# Patient Record
Sex: Male | Born: 1980 | Race: Black or African American | Hispanic: No | Marital: Single
Health system: Southern US, Academic
[De-identification: ages and names within clinical notes are randomized; demographics above are authoritative.]

## PROBLEM LIST (undated history)

## (undated) ENCOUNTER — Encounter

## (undated) ENCOUNTER — Telehealth

## (undated) ENCOUNTER — Encounter: Attending: Infectious Disease | Primary: Infectious Disease

## (undated) ENCOUNTER — Encounter
Attending: Student in an Organized Health Care Education/Training Program | Primary: Student in an Organized Health Care Education/Training Program

## (undated) ENCOUNTER — Ambulatory Visit: Payer: PRIVATE HEALTH INSURANCE | Attending: Infectious Disease | Primary: Infectious Disease

## (undated) ENCOUNTER — Ambulatory Visit

## (undated) ENCOUNTER — Encounter
Attending: Pharmacist Clinician (PhC)/ Clinical Pharmacy Specialist | Primary: Pharmacist Clinician (PhC)/ Clinical Pharmacy Specialist

## (undated) ENCOUNTER — Telehealth: Attending: Registered" | Primary: Registered"

## (undated) ENCOUNTER — Encounter: Attending: Adult Health | Primary: Adult Health

## (undated) ENCOUNTER — Telehealth: Attending: Infectious Disease | Primary: Infectious Disease

## (undated) ENCOUNTER — Telehealth: Attending: Family | Primary: Family

## (undated) ENCOUNTER — Ambulatory Visit
Payer: PRIVATE HEALTH INSURANCE | Attending: Student in an Organized Health Care Education/Training Program | Primary: Student in an Organized Health Care Education/Training Program

## (undated) ENCOUNTER — Ambulatory Visit: Attending: Clinical | Primary: Clinical

## (undated) ENCOUNTER — Ambulatory Visit: Attending: Adult Health | Primary: Adult Health

## (undated) ENCOUNTER — Ambulatory Visit: Attending: Infectious Disease | Primary: Infectious Disease

## (undated) ENCOUNTER — Ambulatory Visit: Payer: PRIVATE HEALTH INSURANCE

## (undated) DIAGNOSIS — B20 Human immunodeficiency virus [HIV] disease: Secondary | ICD-10-CM

## (undated) DIAGNOSIS — J45909 Unspecified asthma, uncomplicated: Secondary | ICD-10-CM

## (undated) HISTORY — PX: CARDIAC CATHETERIZATION: SHX172

## (undated) HISTORY — PX: CARDIAC SURGERY: SHX584

---
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
## 2015-04-09 ENCOUNTER — Encounter (HOSPITAL_COMMUNITY): Payer: Self-pay | Admitting: *Deleted

## 2015-04-09 ENCOUNTER — Emergency Department (HOSPITAL_COMMUNITY): Payer: Self-pay

## 2015-04-09 ENCOUNTER — Observation Stay (HOSPITAL_COMMUNITY)
Admission: EM | Admit: 2015-04-09 | Discharge: 2015-04-10 | Disposition: A | Discharge status: Home / Self Care | Payer: Self-pay | Attending: Family Medicine | Admitting: Family Medicine

## 2015-04-09 DIAGNOSIS — Z79899 Other long term (current) drug therapy: Secondary | ICD-10-CM | POA: Insufficient documentation

## 2015-04-09 DIAGNOSIS — B20 Human immunodeficiency virus [HIV] disease: Secondary | ICD-10-CM | POA: Insufficient documentation

## 2015-04-09 DIAGNOSIS — R9431 Abnormal electrocardiogram [ECG] [EKG]: Secondary | ICD-10-CM

## 2015-04-09 DIAGNOSIS — R519 Headache, unspecified: Secondary | ICD-10-CM | POA: Diagnosis present

## 2015-04-09 DIAGNOSIS — R079 Chest pain, unspecified: Principal | ICD-10-CM | POA: Insufficient documentation

## 2015-04-09 DIAGNOSIS — R0789 Other chest pain: Secondary | ICD-10-CM | POA: Diagnosis present

## 2015-04-09 DIAGNOSIS — Z880 Allergy status to penicillin: Secondary | ICD-10-CM | POA: Insufficient documentation

## 2015-04-09 DIAGNOSIS — J45909 Unspecified asthma, uncomplicated: Secondary | ICD-10-CM | POA: Insufficient documentation

## 2015-04-09 DIAGNOSIS — R51 Headache: Secondary | ICD-10-CM | POA: Insufficient documentation

## 2015-04-09 HISTORY — DX: Unspecified asthma, uncomplicated: J45.909

## 2015-04-09 HISTORY — DX: Human immunodeficiency virus (HIV) disease: B20

## 2015-04-09 LAB — CBC
HCT: 47.1 % (ref 39.0–52.0)
Hemoglobin: 16.2 g/dL (ref 13.0–17.0)
MCH: 33.5 pg (ref 26.0–34.0)
MCHC: 34.4 g/dL (ref 30.0–36.0)
MCV: 97.5 fL (ref 78.0–100.0)
PLATELETS: 241 10*3/uL (ref 150–400)
RBC: 4.83 MIL/uL (ref 4.22–5.81)
RDW: 11.5 % (ref 11.5–15.5)
WBC: 6.1 10*3/uL (ref 4.0–10.5)

## 2015-04-09 LAB — TROPONIN I: Troponin I: <0.03 ng/mL (ref <0.031)

## 2015-04-09 LAB — BASIC METABOLIC PANEL
Anion gap: 7 (ref 5–15)
BUN: 9 mg/dL (ref 6–20)
CALCIUM: 10.6 mg/dL — AB (ref 8.9–10.3)
CO2: 30 mmol/L (ref 22–32)
CREATININE: 1.2 mg/dL (ref 0.61–1.24)
Chloride: 104 mmol/L (ref 101–111)
GFR calc non Af Amer: >60 mL/min (ref >60)
Glucose, Bld: 96 mg/dL (ref 65–99)
Potassium: 4.5 mmol/L (ref 3.5–5.1)
SODIUM: 141 mmol/L (ref 135–145)

## 2015-04-09 LAB — I-STAT TROPONIN, ED: TROPONIN I, POC: 0 ng/mL (ref 0.00–0.08)

## 2015-04-09 MED ORDER — ONDANSETRON HCL 4 MG/2ML IJ SOLN: 4.0000 mg | Freq: Four times a day (QID) | INTRAMUSCULAR | Status: DC | PRN

## 2015-04-09 MED ORDER — FENTANYL CITRATE (PF) 100 MCG/2ML IJ SOLN
50.0000 ug | Freq: Once | INTRAMUSCULAR | Status: AC
  Administered 2015-04-09: 50 ug via INTRAVENOUS
  Filled 2015-04-09: qty 2

## 2015-04-09 MED ORDER — ABACAVIR-DOLUTEGRAVIR-LAMIVUD 600-50-300 MG PO TABS: 1.0000 | ORAL_TABLET | Freq: Every day | ORAL | Status: DC

## 2015-04-09 MED ORDER — ENOXAPARIN SODIUM 40 MG/0.4ML ~~LOC~~ SOLN
40.0000 mg | SUBCUTANEOUS | Status: DC
  Administered 2015-04-09: 40 mg via SUBCUTANEOUS
  Filled 2015-04-09: qty 0.4

## 2015-04-09 MED ORDER — ABACAVIR SULFATE 300 MG PO TABS
600.0000 mg | ORAL_TABLET | Freq: Every day | ORAL | Status: DC
  Administered 2015-04-09 – 2015-04-10 (×2): 600 mg via ORAL
  Filled 2015-04-09 (×2): qty 2

## 2015-04-09 MED ORDER — METHOCARBAMOL 750 MG PO TABS
750.0000 mg | ORAL_TABLET | Freq: Four times a day (QID) | ORAL | Status: DC | PRN
Start: 2015-04-09 — End: 2015-04-10

## 2015-04-09 MED ORDER — ACETAMINOPHEN 325 MG PO TABS: 650.0000 mg | ORAL_TABLET | ORAL | Status: DC | PRN

## 2015-04-09 MED ORDER — ASPIRIN EC 325 MG PO TBEC
325.0000 mg | DELAYED_RELEASE_TABLET | Freq: Every day | ORAL | Status: DC
  Administered 2015-04-10: 325 mg via ORAL
  Filled 2015-04-09: qty 1

## 2015-04-09 MED ORDER — GI COCKTAIL ~~LOC~~: 30.0000 mL | Freq: Four times a day (QID) | ORAL | Status: DC | PRN

## 2015-04-09 MED ORDER — ADULT MULTIVITAMIN W/MINERALS CH
1.0000 | ORAL_TABLET | Freq: Every day | ORAL | Status: DC
  Administered 2015-04-09 – 2015-04-10 (×2): 1 via ORAL
  Filled 2015-04-09 (×3): qty 1

## 2015-04-09 MED ORDER — DOLUTEGRAVIR SODIUM 50 MG PO TABS
50.0000 mg | ORAL_TABLET | Freq: Every day | ORAL | Status: DC
  Administered 2015-04-09 – 2015-04-10 (×2): 50 mg via ORAL
  Filled 2015-04-09 (×2): qty 1

## 2015-04-09 MED ORDER — MORPHINE SULFATE (PF) 4 MG/ML IV SOLN
4.0000 mg | Freq: Once | INTRAVENOUS | Status: DC
  Filled 2015-04-09: qty 1

## 2015-04-09 MED ORDER — ALPRAZOLAM 0.25 MG PO TABS: 0.2500 mg | ORAL_TABLET | Freq: Two times a day (BID) | ORAL | Status: DC | PRN

## 2015-04-09 MED ORDER — ASPIRIN 81 MG PO CHEW
324.0000 mg | CHEWABLE_TABLET | Freq: Once | ORAL | Status: AC
  Administered 2015-04-09: 324 mg via ORAL
  Filled 2015-04-09: qty 4

## 2015-04-09 MED ORDER — NITROGLYCERIN 0.4 MG SL SUBL
0.4000 mg | SUBLINGUAL_TABLET | SUBLINGUAL | Status: DC | PRN
  Administered 2015-04-09 (×3): 0.4 mg via SUBLINGUAL
  Filled 2015-04-09: qty 1

## 2015-04-09 MED ORDER — LAMIVUDINE 150 MG PO TABS
300.0000 mg | ORAL_TABLET | Freq: Every day | ORAL | Status: DC
  Administered 2015-04-09 – 2015-04-10 (×2): 300 mg via ORAL
  Filled 2015-04-09 (×2): qty 2

## 2015-04-09 MED ORDER — OXYCODONE-ACETAMINOPHEN 5-325 MG PO TABS
1.0000 | ORAL_TABLET | ORAL | Status: DC | PRN
  Administered 2015-04-09 – 2015-04-10 (×2): 1 via ORAL
  Filled 2015-04-09 (×2): qty 1

## 2015-04-09 MED ORDER — ALBUTEROL SULFATE (2.5 MG/3ML) 0.083% IN NEBU: 2.5000 mg | INHALATION_SOLUTION | Freq: Four times a day (QID) | RESPIRATORY_TRACT | Status: DC | PRN

## 2015-04-09 NOTE — ED Notes (Signed)
MD at bedside. 

## 2015-04-09 NOTE — ED Notes (Signed)
Cardiologist at bedside.  

## 2015-04-09 NOTE — H&P (Signed)
[facility] History and Physical                                                                                    Bill HumCreig Klauer, is a 34 y.o. male  MRN: [id number]   DOB - Sep 25, 1980  Admit Date - 04/09/2015  Outpatient Primary MD for the patient is No PCP Per Patient  Patient receives his care in [place].  Referring Physician:  Dr. Judd Lienelo  Chief Complaint:   Chief Complaint  Patient presents with  . Headache  . Chest Pain     HPI  Bill Rodriguez  is a 34 y.o. male, with HIV and asthma who presents to the emergency department with chest pain. Mr. Jennette Kettleeal is visiting from [facility] as his grandmother is a patient here in the hospital and in poor health. He has been here with his grandmother for several days. He has been suffering for several days with a severe headache that travels up his neck and feels like pressure in the front of his head. It is atypical for him to have this type headache. This morning upon waking his headache was severe, he felt dizzy and diaphoretic and developed an atypical chest pain that started under his right shoulder and radiated down into his central chest. It has been intermittent all day, and appears to be worse with exertion. Sublingual nitroglycerin did not improve the chest pain. Fentanyl has given him some relief.  In the emergency department, the patient's point-of-care troponin was 0 and his labs were reassuring, but his EKG looked like an acute STEMI and cardiology was called. The patient reports that in 2015 he had an abnormal EKG and went to cardiac cath. He reportedly had clean coronaries. Cardiology has requested the patient be admitted for observation and chest pain rule out.    Review of Systems   In addition to the HPI above,  No Fever-chills, No problems swallowing food or Liquids, No Abdominal pain, No Nausea or Vomiting, Bowel movements are regular, No Blood in stool or Urine, No dysuria, No new skin rashes or bruises, No new joints  pains-aches,  No new weakness, tingling, numbness in any extremity, No recent weight gain or loss, A full 10 point Review of Systems was done, except as stated above, all other Review of Systems were negative.  Past Medical History  Past Medical History  Diagnosis Date  . HIV disease (HCC)   . Asthma     Past Surgical History  Procedure Laterality Date  . Cardiac surgery    . Cardiac catheterization       Social History Social History  Substance Use Topics  . Smoking status: Never Smoker   . Smokeless tobacco: Not on file  . Alcohol Use: No    Family History His grandmother has had 2 strokes and an MI.  Prior to Admission medications   Medication Sig Start Date End Date Taking? Authorizing Provider  Abacavir-Dolutegravir-Lamivud (TRIUMEQ) 600-50-300 MG TABS Take 1 tablet by mouth daily.   Yes Historical Provider, MD  albuterol (PROVENTIL HFA;VENTOLIN HFA) 108 (90 BASE) MCG/ACT inhaler Inhale 2 puffs into the lungs every 6 (six) hours as needed  for wheezing or shortness of breath.   Yes Historical Provider, MD  Multiple Vitamin (MULTIVITAMIN) tablet Take 1 tablet by mouth daily.   Yes Historical Provider, MD  sildenafil (VIAGRA) 50 MG tablet Take 25-50 mg by mouth daily as needed for erectile dysfunction (15 minutes prior to sex).   Yes Historical Provider, MD    Allergies  Allergen Reactions  . Penicillins     Physical Exam  Vitals  Blood pressure 110/77, pulse 76, temperature 99.3 F (37.4 C), temperature source Oral, resp. rate 15, height  (1.753 m), SpO2 100 %.   General:  Thin, well-developed, well-nourished male lying in bed in NAD  Psych:  Normal affect and insight, Not Suicidal or Homicidal, Awake Alert, Oriented X 3.  Neuro:   No F.N deficits, ALL C.Nerves Intact, Strength 5/5 all 4 extremities, Sensation intact all 4 extremities.  ENT:  Ears and Eyes appear Normal, Conjunctivae clear, PER. Moist oral mucosa without erythema or exudates.  Neck:   Supple, No lymphadenopathy appreciated  Respiratory:  Symmetrical chest wall movement, Good air movement bilaterally, CTAB.  Cardiac:  RRR, No Murmurs, no LE edema noted, no JVD.    Abdomen:  Positive bowel sounds, Soft, Non tender, Non distended,  No masses appreciated  Skin:  No Cyanosis, Normal Skin Turgor, No Skin Rash or Bruise.  Extremities:  Able to move all 4. 5/5 strength in each,  no effusions.  Data Review  CBC  Recent Labs Lab 04/09/15 1353  WBC 6.1  HGB 16.2  HCT 47.1  PLT 241  MCV 97.5  MCH 33.5  MCHC 34.4  RDW 11.5    Chemistries   Recent Labs Lab 04/09/15 1353  NA 141  K 4.5  CL 104  CO2 30  GLUCOSE 96  BUN 9  CREATININE 1.20  CALCIUM 10.6*    Imaging results:   Dg Chest Portable 1 View  04/09/2015  CLINICAL DATA:  Chest pain EXAM: PORTABLE CHEST 1 VIEW COMPARISON:  None. FINDINGS: Normal heart size. Clear lungs. No pneumothorax. No pleural effusion. IMPRESSION: No active disease. Electronically Signed   By: Jolaine Click M.D.   On: 04/09/2015 14:34    My personal review of EKG: Abnormal EKG with ST elevation in multiple leads.   Assessment & Plan  Principal Problem:   Chest pain Active Problems:   Headache   HIV (human immunodeficiency virus infection) (HCC)    Chest pain  Atypical in nature. Associated with dizziness, diaphoresis, and some nausea.  Chest pain is likely worsened due to anxiety. Appreciate cardiology consultation and recommendations. We will cycle troponin. Have ordered a GI cocktail, sublingual nitroglycerin, Robaxin, when necessary Xanax, aspirin. Check 2-D echocardiogram.  Tension headache Due to his current stressful situation.  Will order when necessary Tylenol, Robaxin, heat packs  HIV Stable.  03/10/15 CD-4 count 874. Continue HIV medications. Patient receives his care in [place].    Consultants Called:  Cardiology, Dr. Patty Sermons  Family Communication:   Patient is alert, orientated and  understands their plan of care.  Code Status:  Full  Condition:  Guarded.  Potential Disposition: likely discharge 10/17 if work up is negative.  Time spent in minutes : [address]   [facility] Algis Downs,  [place] on 04/09/2015 at 3:56 PM Between 7am to 7pm - Pager - [phone] After 7pm go to [email or web address] - password [email or web address] And look for the night coverage person covering me after hours

## 2015-04-09 NOTE — ED Notes (Signed)
Attempted to call report

## 2015-04-09 NOTE — Consult Note (Signed)
CARDIOLOGY CONSULT NOTE   Patient ID: Bill Rodriguez MRN: [id number], DOB/AGE: 34   Admit date: 04/09/2015 Date of Consult: 04/09/2015   Primary Physician: No PCP Per Patient Primary Cardiologist: None  Pt. Profile  34 year old gentleman presents with headache and dizziness.  Abnormal EKG prompted question of possible STEMI.  No prior history of heart disease and he had a normal left heart catheter by right radial artery approach at [facility] in July 2015.  Problem List  Past Medical History  Diagnosis Date  . HIV disease (HCC)   . Asthma     Past Surgical History  Procedure Laterality Date  . Cardiac surgery    . Cardiac catheterization       Allergies  Allergies  Allergen Reactions  . Penicillins     HPI   This 34 year old African-American gentleman presented to the emergency room with complaints of headache and dizziness.  He states that the headaches have been present for several days and appeared to be getting worse.  He also complained of some radiation of the headache pain into his chest.  An EKG was done and a possible code STEMI was called and cardiology was asked to see him.  The patient does not have any history of known heart problems.  He does have a history of positive HIV and is followed at the HIV clinic in [place].  He does not have any available prior electrocardiogram available in epic system.  Review of the EKG taken today suggests that the ST segment elevation represents extreme benign early repolarization rather than an acute STEMI.  Interestingly, the patient gives a history that in July 2015 he had chest pain and was taken emergently to the cardiac catheterization lab at [facility] and was found to have no heart disease and he was told afterwards that he did not need to be on any aspirin or other cardiac medication.  I suspect that his electrocardiogram at that time probably looked similar to this although we do not have any actual prior EKG  to look at today.  His initial troponin I point of care today is 0.00. The patient typically stays physically active.  He goes to the gym on an almost daily basis.  He enjoys swimming for exercise as well.  He denies any history of exertional chest pain. He is on no cardiovascular medications.  He does use albuterol on a as-needed basis for wheezing and asthma.  He is on Triumeq for his HIV.  Inpatient Medications  .  morphine injection  4 mg Intravenous Once    Family History History reviewed. No pertinent family history.   Social History Social History   Social History  . Marital Status: Single    Spouse Name: N/A  . Number of Children: N/A  . Years of Education: N/A   Occupational History  . Not on file.   Social History Main Topics  . Smoking status: Never Smoker   . Smokeless tobacco: Not on file  . Alcohol Use: No  . Drug Use: No  . Sexual Activity: Not on file   Other Topics Concern  . Not on file   Social History Narrative  . No narrative on file     Review of Systems  General:  No chills, fever, night sweats or weight changes.  Cardiovascular:  No chest pain, dyspnea on exertion, edema, orthopnea, palpitations, paroxysmal nocturnal dyspnea. Dermatological: No rash, lesions/masses Respiratory: No cough, dyspnea Urologic: No hematuria, dysuria Abdominal:  No nausea, vomiting, diarrhea, bright red blood per rectum, melena, or hematemesis Neurologic:  No visual changes, wkns, changes in mental status.  Positive for headache and dizziness. All other systems reviewed and are otherwise negative except as noted above.  Physical Exam  Blood pressure 115/76, pulse 69, temperature 99.3 F (37.4 C), temperature source Oral, resp. rate 19, height 5\' 9"  (1.753 m), SpO2 100 %.  General: Pleasant, NAD Psych: Normal affect. Neuro: Alert and oriented X 3. Moves all extremities spontaneously. HEENT: Normal  Neck: Supple without bruits or JVD. Lungs:  Resp regular and  unlabored, CTA. Heart: RRR no s3, s4, or murmurs. Abdomen: Soft, non-tender, non-distended, BS + x 4.  Extremities: No clubbing, cyanosis or edema. DP/PT/Radials 2+ and equal bilaterally.  Labs  No results for input(s): CKTOTAL, CKMB, TROPONINI in the last 72 hours. Lab Results  Component Value Date   WBC 6.1 04/09/2015   HGB 16.2 04/09/2015   HCT 47.1 04/09/2015   MCV 97.5 04/09/2015   PLT 241 04/09/2015     Recent Labs Lab 04/09/15 1353  NA 141  K 4.5  CL 104  CO2 30  BUN 9  CREATININE 1.20  CALCIUM 10.6*  GLUCOSE 96   No results found for: CHOL, HDL, LDLCALC, TRIG No results found for: DDIMER  Radiology/Studies  Dg Chest Portable 1 View  04/09/2015  CLINICAL DATA:  Chest pain EXAM: PORTABLE CHEST 1 VIEW COMPARISON:  None. FINDINGS: Normal heart size. Clear lungs. No pneumothorax. No pleural effusion. IMPRESSION: No active disease. Electronically Signed   By: Jolaine ClickArthur  Hoss M.D.   On: 04/09/2015 14:34    ECG  Normal sinus rhythm.  Marked ST elevation in V2 through V5 most likely secondary to benign early repolarization.  No prior EKGs available for comparison  ASSESSMENT AND PLAN  1.  Abnormal EKG.  This appears to be most likely secondary to marked benign early repolarization.  History of [facility] heart [facility] in [facility] 2015 2.  Headaches and dizziness of uncertain etiology 3.  Positive HIV [facility]  Recommendation: Recommend observation overnight on medical service.  Evaluation of headache and dizziness per medical service.  Get serial cardiac enzymes.  Get follow-up EKG.  At discharge it would be helpful for the patient to carry a copy of his EKG with him since he is followed at several different hospitals.   Karie SchwalbeSigned, Charlane Westry MD  04/09/2015, 2:54 PM

## 2015-04-09 NOTE — ED Provider Notes (Signed)
CSN: [id number]     Arrival date & time 04/09/15  1321 History   First MD Initiated Contact with Patient 04/09/15 1352     Chief Complaint  Patient presents with  . Headache  . Chest Pain     (Consider location/radiation/quality/duration/timing/severity/associated sxs/prior Treatment) HPI Comments: Patient is a 34 year old male with history of HIV disease, asthma. He presents for evaluation of chest tightness. He states that he was upstairs visiting his family member who is ill. While he was there he developed heaviness in his chest that radiates to both shoulders. He is also reporting a headache. He denies any shortness of breath, nausea, or diaphoresis.  Patient does report that he had a heart cath performed at [facility] little over one year ago which revealed no blockages.  Patient is a 34 y.o. male presenting with chest pain. The history is provided by the patient.  Chest Pain Pain location:  Substernal area Pain quality: pressure   Pain radiates to:  Does not radiate Pain radiates to the back: no   Pain severity:  Moderate Onset quality:  Sudden Duration:  3 days Timing:  Intermittent Progression:  Worsening Chronicity:  New Relieved by:  Nothing Worsened by:  Nothing tried Ineffective treatments:  None tried   Past Medical History  Diagnosis Date  . HIV disease (HCC)   . Asthma    Past Surgical History  Procedure Laterality Date  . Cardiac surgery    . Cardiac catheterization     History reviewed. No pertinent family history. Social History  Substance Use Topics  . Smoking status: Never Smoker   . Smokeless tobacco: None  . Alcohol Use: No    Review of Systems  Cardiovascular: Positive for chest pain.  All other systems reviewed and are negative.     Allergies  Penicillins  Home Medications   Prior to Admission medications   Medication Sig Start Date End Date Taking? Authorizing Provider  Abacavir-Dolutegravir-Lamivud (TRIUMEQ) 600-50-300 MG TABS Take  1 tablet by mouth daily.   Yes Historical Provider, MD  albuterol (PROVENTIL HFA;VENTOLIN HFA) 108 (90 BASE) MCG/ACT inhaler Inhale 2 puffs into the lungs every 6 (six) hours as needed for wheezing or shortness of breath.   Yes Historical Provider, MD  Multiple Vitamin (MULTIVITAMIN) tablet Take 1 tablet by mouth daily.   Yes Historical Provider, MD  sildenafil (VIAGRA) 50 MG tablet Take 25-50 mg by mouth daily as needed for erectile dysfunction (15 minutes prior to sex).   Yes Historical Provider, MD   BP 115/76 mmHg  Pulse 69  Temp(Src) 99.3 F (37.4 C) (Oral)  Resp 19  Ht  (1.753 m)  SpO2 100% Physical Exam  Constitutional: He is oriented to person, place, and time. He appears well-developed and well-nourished. No distress.  HENT:  Head: Normocephalic and atraumatic.  Neck: Normal range of motion. Neck supple.  Cardiovascular: Normal rate, regular rhythm and normal heart sounds.   No murmur heard. Pulmonary/Chest: Effort normal and breath sounds normal. No respiratory distress. He has no wheezes. He exhibits no tenderness.  Abdominal: Soft. Bowel sounds are normal. He exhibits no distension. There is no tenderness.  Musculoskeletal: Normal range of motion. He exhibits no edema.  Neurological: He is alert and oriented to person, place, and time.  Skin: Skin is warm and dry. He is not diaphoretic.  Nursing note and vitals reviewed.   ED Course  Procedures (including critical care time) Labs Review Labs Reviewed  BASIC METABOLIC PANEL - Abnormal; Notable for  the following:    Calcium 10.6 (*)    All other components within normal limits  CBC  I-STAT TROPOININ, ED    Imaging Review Dg Chest Portable 1 View  04/09/2015  CLINICAL DATA:  Chest pain EXAM: PORTABLE CHEST 1 VIEW COMPARISON:  None. FINDINGS: Normal heart size. Clear lungs. No pneumothorax. No pleural effusion. IMPRESSION: No active disease. Electronically Signed   By: Jolaine ClickArthur  Hoss M.D.   On: 04/09/2015 14:34    I have personally reviewed and evaluated these images and lab results as part of my medical decision-making.   EKG Interpretation   Date/Time:  Sunday April 09 2015 13:44:52 EDT Ventricular Rate:  76 PR Interval:  124 QRS Duration: 78 QT Interval:  340 QTC Calculation: 382 R Axis:   59 Text Interpretation:  Critical Test Result: STEMI Normal sinus rhythm  ST elevation consider anterior injury or acute infarct ** ** ACUTE MI /  STEMI ** ** Abnormal ECG Confirmed by RAY MD, Duwayne HeckANIELLE ([id number]) on  04/09/2015 1:46:41 PM      MDM   Final diagnoses:  None    Patient presents with chest pain. He is here visiting a family member upstairs and it is a very stressful situation. He reports chest discomfort that has been ongoing intermittently for the past few days. It became much worse this afternoon. His initial EKG in triage revealed ST segment elevation in the anterior leads with no reciprocal changes. Due to the nature of his symptoms and the findings on the EKG, a code STEMI was called and the patient was immediately brought back to the emergency exam room. The initial troponin returned negative and the patient was evaluated by cardiology.  On further questioning of the patient, it was determined he had a [facility] heart [facility] little over one year ago. I was unable to obtain these records or an old EKG through the outside record file on the electronic medical record. The patient was seen by Dr. Patty SermonsBrackbill who canceled the code STEMI. He is recommending admission to medicine. I've spoken with the admitting team who will evaluate and admit the patient to the hospitalist service.  Geoffery Lyonsouglas Cletis Clack, MD 04/09/15 [phone]

## 2015-04-09 NOTE — ED Notes (Signed)
X-Ray at bedside.

## 2015-04-09 NOTE — ED Notes (Signed)
Pt reports recent headaches and sharp pains that radiates down his shoulders and into his chest and having mid chest tightness and reports mild sob this am. No acute distress noted at triage, ekg done, spo2 100%.

## 2015-04-10 ENCOUNTER — Other Ambulatory Visit (HOSPITAL_COMMUNITY): Payer: Self-pay

## 2015-04-10 DIAGNOSIS — R51 Headache: Secondary | ICD-10-CM

## 2015-04-10 DIAGNOSIS — Z21 Asymptomatic human immunodeficiency virus [HIV] infection status: Secondary | ICD-10-CM

## 2015-04-10 DIAGNOSIS — R072 Precordial pain: Secondary | ICD-10-CM

## 2015-04-10 DIAGNOSIS — R0789 Other chest pain: Secondary | ICD-10-CM

## 2015-04-10 LAB — TROPONIN I: Troponin I: <0.03 ng/mL (ref <0.031)

## 2015-04-10 MED ORDER — ALPRAZOLAM 0.25 MG PO TABS: 0.2500 mg | ORAL_TABLET | Freq: Two times a day (BID) | ORAL | Status: AC | PRN

## 2015-04-10 MED ORDER — IBUPROFEN 200 MG PO TABS: 600.0000 mg | ORAL_TABLET | Freq: Three times a day (TID) | ORAL | Status: AC | PRN

## 2015-04-10 NOTE — Progress Notes (Signed)
Pt discharged to home, condition stable, discharge education reviewed with nurse, pt verbalized understanding, left floor with family member, ambulatory.  Raymon MuttonGwen Brazen Domangue RN

## 2015-04-10 NOTE — Discharge Instructions (Signed)

## 2015-04-10 NOTE — Discharge Summary (Signed)
Physician Discharge Summary  Bill Rodriguez RUE:[id number] DOB: 05/23/1981 DOA: 04/09/2015  PCP: No PCP Per Patient  Admit date: 04/09/2015 Discharge date: 04/10/2015  Time spent: 45 minutes  Recommendations for Outpatient Follow-up:   1. Needs follow-up as an outpatient for noncardiac chest pain which I suspect is secondary to anxiety from familial issues 2. Prescribed limited amount of Xanax this admission 30 tablets for anxiety and also recommended ibuprofen OTC 3 times a day for couple days for headaches  Discharge Diagnoses:  Principal Problem:   Chest pain Active Problems:   Headache   HIV (human immunodeficiency virus infection) (HCC)   Atypical chest pain   Discharge Condition: Good  Diet recommendation: Heart healthy low-salt  Filed Weights   04/09/15 1658 04/10/15 0506  Weight: 72.621 kg (160 lb 1.6 oz) 74.707 kg (164 lb 11.2 oz)    Hospital Course:  Pleasant 34 year old HIV [last CD4 74] positive homosexual male followed at [facility]  admitted to the hospital chest pain in setting of grandmother being ill and on life support On admission cardiology was consulted was noted that he had predominantly J-point elevation in keeping with habitus being very thin. Chest pain was associated with headache, neck pain and no radiation. 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    Discharge Instructions   Discharge Instructions    Diet - low sodium heart healthy    Complete by:  As directed      Discharge instructions    Complete by:  As directed   It is unlikely you have had a heart issue Your tests are negative and non-suggestive of this For your headaches you may take Ibuprofen short term [around 3 days] at a dose of 600 mg and you can take this upto 3 times a day Please follow up with your regular MD as an outpatient if these issues recur     Increase activity slowly    Complete by:  As directed           Current Discharge Medication List    START taking these medications   Details  ALPRAZolam (XANAX) 0.25 MG tablet Take 1 tablet (0.25 mg total) by mouth 2 (two) times daily as needed for anxiety. Qty: 30 tablet, Refills: 0    ibuprofen (ADVIL) 200 MG tablet Take 3 tablets (600 mg total) by mouth every 8 (eight) hours as needed for headache. Qty: 30 tablet, Refills: 0      CONTINUE these medications which have NOT CHANGED   Details  Abacavir-Dolutegravir-Lamivud (TRIUMEQ) 600-50-300 MG TABS Take 1 tablet by mouth daily.    albuterol (PROVENTIL HFA;VENTOLIN HFA)  108 (90 BASE) MCG/ACT inhaler Inhale 2 puffs into the lungs every 6 (six) hours as needed for wheezing or shortness of breath.    Multiple Vitamin (MULTIVITAMIN) tablet Take 1 tablet by mouth daily.    sildenafil (VIAGRA) 50 MG tablet Take 25-50 mg by mouth daily as needed for erectile dysfunction (15 minutes prior to sex).       Allergies  Allergen Reactions  . Penicillins       The results of significant diagnostics from this hospitalization (including imaging, microbiology, ancillary and  laboratory) are listed below for reference.    Significant Diagnostic Studies: Dg Chest Portable 1 View  04/09/2015  CLINICAL DATA:  Chest pain EXAM: PORTABLE CHEST 1 VIEW COMPARISON:  None. FINDINGS: Normal heart size. Clear lungs. No pneumothorax. No pleural effusion. IMPRESSION: No active disease. Electronically Signed   By: Jolaine ClickArthur  Hoss M.D.   On: 04/09/2015 14:34    Microbiology: No results found for this or any previous visit (from the past 240 hour(s)).   Labs: Basic Metabolic Panel:  Recent Labs Lab 04/09/15 1353  NA 141  K 4.5  CL 104  CO2 30  GLUCOSE 96  BUN 9  CREATININE 1.20  CALCIUM 10.6*   Liver Function Tests: No results for input(s): AST, ALT, ALKPHOS, BILITOT, PROT, ALBUMIN in the last 168 hours. No results for input(s): LIPASE, AMYLASE in the last 168 hours. No results for input(s): AMMONIA in the last 168 hours. CBC:  Recent Labs Lab 04/09/15 1353  WBC 6.1  HGB 16.2  HCT 47.1  MCV 97.5  PLT 241   Cardiac Enzymes:  Recent Labs Lab 04/09/15 1753 04/09/15 2330 04/10/15 0436  TROPONINI <0.03 <0.03 <0.03   BNP: BNP (last 3 results) No results for input(s): BNP in the last 8760 hours.  ProBNP (last 3 results) No results for input(s): PROBNP in the last 8760 hours.  CBG: No results for input(s): GLUCAP in the last 168 hours.     SignedRhetta Mura:  Esly Selvage, JAI-GURMUKH  [facility] 04/10/2015, 9:28 AM    HIV: --started triumeq 2014  -Had 4-5 day gap while waiting for meds to come, not sure what hold up was  Had perioral rash break out a couple times in last few months that ingles Rash is little white bumps like whiteheads -has not figured out trigger -no rash on rest of body, no swelling of tongue or back or throat -previously happened to kiwi -not eat mango  Asthma - -using albuterol about once every two weeks  -no asthma attacks  Primary Hyperparathyroidism: - last two Ca+ has been 10.3 with previous at 8.9 -work up for  hyperparathyroidism including scan showed no adenoma -takes MVI and vitamin D -no kidney stones ever  Anxiety: -weaned off xanax and celexa -feels like anxiety better  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

## 2016-08-23 IMAGING — CR DG CHEST 1V PORT
1 series · 1 of 1 positions shown · non-contrast
Comparison: None.

CLINICAL DATA: Chest pain

EXAM:
PORTABLE CHEST 1 VIEW

[AP]
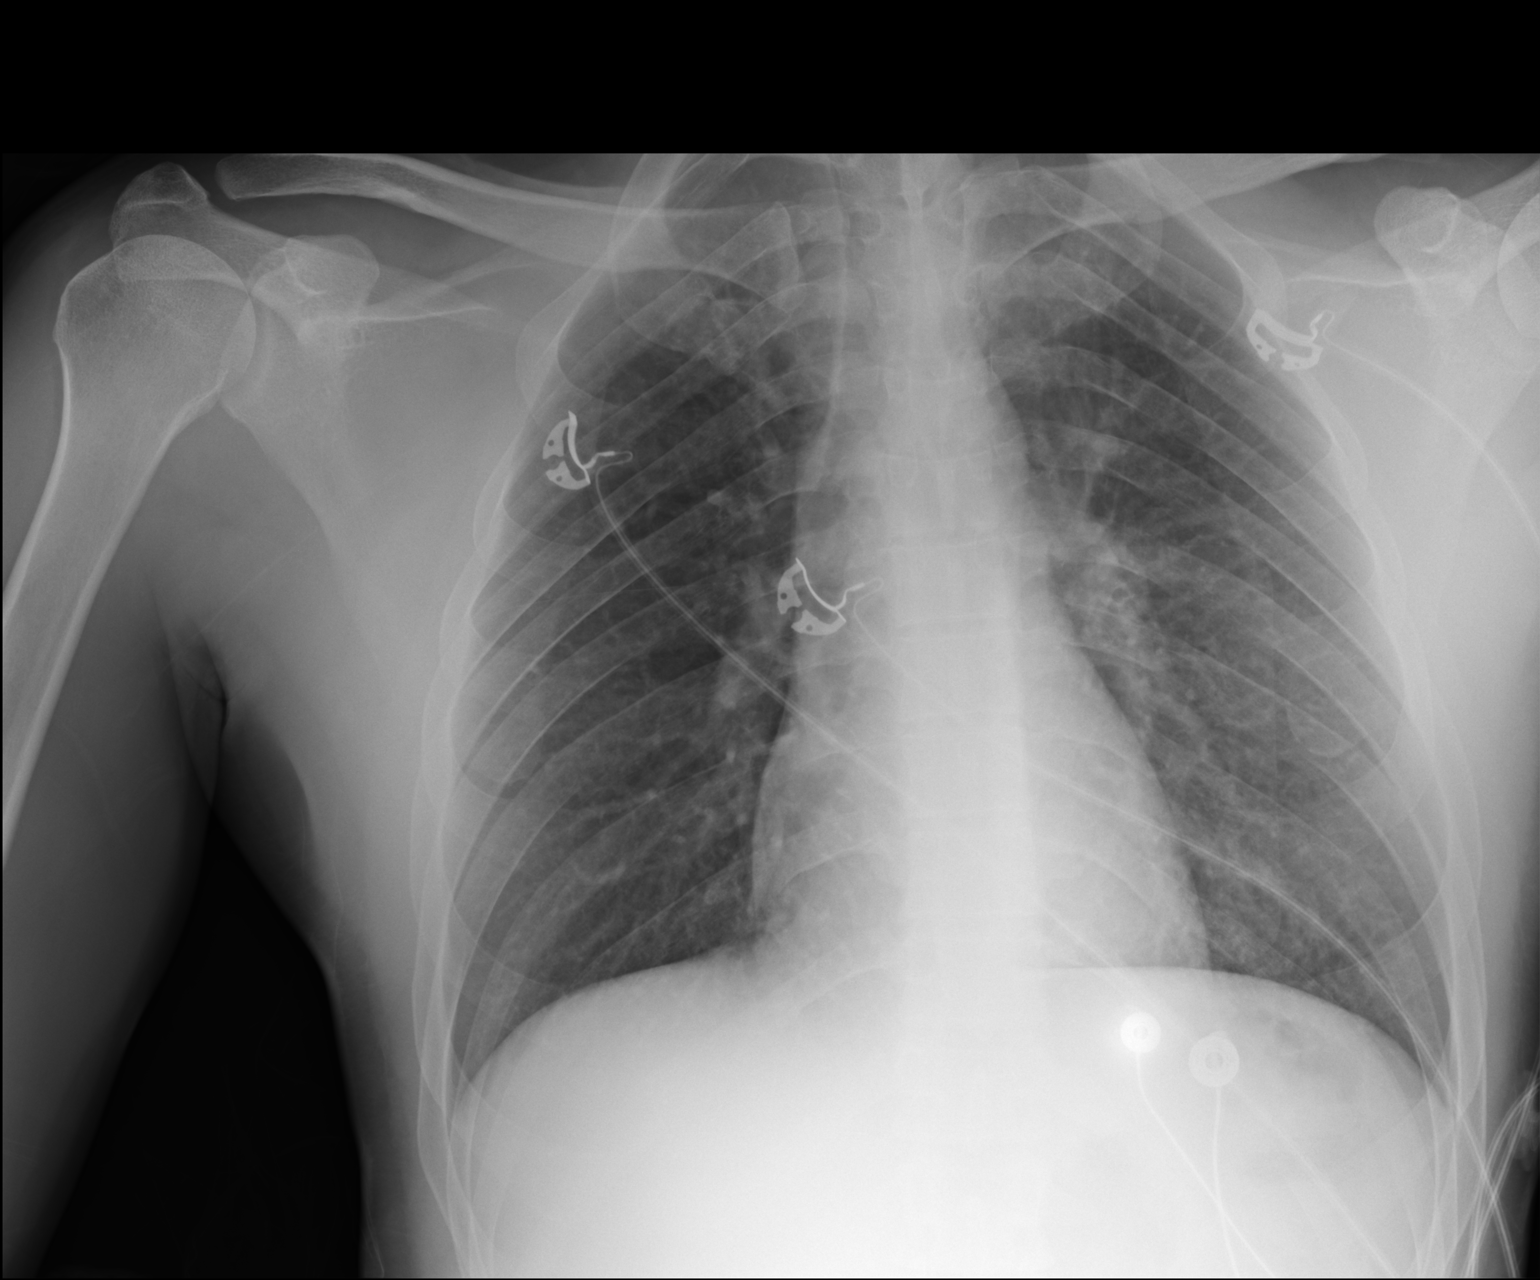

[1 of 1 positions shown; findings below may reference images not displayed]

FINDINGS: Normal heart size. Clear lungs. No pneumothorax. No pleural
effusion.
IMPRESSION: No active disease.

## 2017-03-05 ENCOUNTER — Ambulatory Visit: Admission: RE | Admit: 2017-03-05 | Discharge: 2017-03-05 | Disposition: A | Discharge status: Home / Self Care

## 2017-03-05 DIAGNOSIS — Z113 Encounter for screening for infections with a predominantly sexual mode of transmission: Secondary | ICD-10-CM

## 2017-03-05 DIAGNOSIS — B20 Human immunodeficiency virus [HIV] disease: Principal | ICD-10-CM

## 2017-03-13 DIAGNOSIS — K115 Sialolithiasis: Principal | ICD-10-CM

## 2017-03-13 DIAGNOSIS — M545 Low back pain: Secondary | ICD-10-CM

## 2017-03-13 DIAGNOSIS — G8929 Other chronic pain: Secondary | ICD-10-CM

## 2017-06-13 ENCOUNTER — Ambulatory Visit: Admission: RE | Admit: 2017-06-13 | Discharge: 2017-06-13 | Disposition: A | Discharge status: Home / Self Care

## 2017-06-13 DIAGNOSIS — R369 Urethral discharge, unspecified: Principal | ICD-10-CM

## 2017-06-13 MED ORDER — AZITHROMYCIN 250 MG TABLET
ORAL_TABLET | Freq: Every day | ORAL | 0 refills | 0 days | Status: CP
Start: 2017-06-13 — End: 2017-06-18

## 2017-07-02 ENCOUNTER — Ambulatory Visit: Admit: 2017-07-02 | Discharge: 2017-07-03 | Attending: Infectious Disease | Primary: Infectious Disease

## 2017-07-02 DIAGNOSIS — B20 Human immunodeficiency virus [HIV] disease: Secondary | ICD-10-CM

## 2017-07-02 DIAGNOSIS — F409 Phobic anxiety disorder, unspecified: Secondary | ICD-10-CM

## 2017-07-02 DIAGNOSIS — F5105 Insomnia due to other mental disorder: Secondary | ICD-10-CM

## 2017-07-02 DIAGNOSIS — K210 Gastro-esophageal reflux disease with esophagitis: Secondary | ICD-10-CM

## 2017-07-02 DIAGNOSIS — J4520 Mild intermittent asthma, uncomplicated: Secondary | ICD-10-CM

## 2017-07-02 MED ORDER — TRAZODONE 50 MG TABLET
ORAL_TABLET | Freq: Every evening | ORAL | 11 refills | 0.00000 days | Status: CP
Start: 2017-07-02 — End: 2018-05-27

## 2017-07-02 MED ORDER — AZITHROMYCIN 500 MG TABLET
ORAL_TABLET | Freq: Every day | ORAL | 0 refills | 0.00000 days | Status: CP
Start: 2017-07-02 — End: 2017-07-17

## 2017-07-02 MED ORDER — ABACAVIR 600 MG-DOLUTEGRAVIR 50 MG-LAMIVUDINE 300 MG TABLET
ORAL_TABLET | ORAL | 11 refills | 0.00000 days | Status: CP
Start: 2017-07-02 — End: 2017-11-19

## 2017-07-02 MED ORDER — FAMOTIDINE 40 MG TABLET
ORAL_TABLET | Freq: Every evening | ORAL | 3 refills | 0 days | Status: CP | PRN
Start: 2017-07-02 — End: 2017-11-19

## 2017-07-02 MED ORDER — ALBUTEROL SULFATE HFA 90 MCG/ACTUATION AEROSOL INHALER
Freq: Four times a day (QID) | RESPIRATORY_TRACT | 4 refills | 0 days | Status: CP | PRN
Start: 2017-07-02 — End: 2017-11-19

## 2017-09-09 MED ORDER — MECLIZINE 25 MG CHEWABLE TABLET
ORAL_TABLET | Freq: Three times a day (TID) | ORAL | 0 refills | 0.00000 days | Status: CP | PRN
Start: 2017-09-09 — End: 2017-09-23

## 2017-09-25 ENCOUNTER — Institutional Professional Consult (permissible substitution): Admit: 2017-09-25 | Discharge: 2017-09-26

## 2017-09-25 DIAGNOSIS — Z113 Encounter for screening for infections with a predominantly sexual mode of transmission: Principal | ICD-10-CM

## 2017-10-08 ENCOUNTER — Institutional Professional Consult (permissible substitution): Admit: 2017-10-08 | Discharge: 2017-10-09

## 2017-10-08 DIAGNOSIS — A549 Gonococcal infection, unspecified: Principal | ICD-10-CM

## 2017-11-19 ENCOUNTER — Ambulatory Visit: Admit: 2017-11-19 | Discharge: 2017-11-19 | Attending: Infectious Disease | Primary: Infectious Disease

## 2017-11-19 DIAGNOSIS — Z13220 Encounter for screening for lipoid disorders: Secondary | ICD-10-CM

## 2017-11-19 DIAGNOSIS — B20 Human immunodeficiency virus [HIV] disease: Secondary | ICD-10-CM

## 2017-11-19 DIAGNOSIS — J4520 Mild intermittent asthma, uncomplicated: Secondary | ICD-10-CM

## 2017-11-19 DIAGNOSIS — K210 Gastro-esophageal reflux disease with esophagitis: Secondary | ICD-10-CM

## 2017-11-19 DIAGNOSIS — Z113 Encounter for screening for infections with a predominantly sexual mode of transmission: Principal | ICD-10-CM

## 2017-11-19 MED ORDER — ALBUTEROL SULFATE HFA 90 MCG/ACTUATION AEROSOL INHALER
Freq: Four times a day (QID) | RESPIRATORY_TRACT | 4 refills | 0.00000 days | Status: CP | PRN
Start: 2017-11-19 — End: 2017-11-19

## 2017-11-19 MED ORDER — FAMOTIDINE 40 MG TABLET
ORAL_TABLET | Freq: Every evening | ORAL | 3 refills | 0 days | Status: CP | PRN
Start: 2017-11-19 — End: 2018-05-27

## 2017-11-19 MED ORDER — ALBUTEROL SULFATE HFA 90 MCG/ACTUATION AEROSOL INHALER: 2 | Inhaler | Freq: Four times a day (QID) | 4 refills | 0 days | Status: AC

## 2017-11-19 MED ORDER — ABACAVIR 600 MG-DOLUTEGRAVIR 50 MG-LAMIVUDINE 300 MG TABLET
ORAL_TABLET | ORAL | 11 refills | 0 days | Status: CP
Start: 2017-11-19 — End: 2018-05-27

## 2018-03-04 ENCOUNTER — Institutional Professional Consult (permissible substitution): Admit: 2018-03-04 | Discharge: 2018-03-05

## 2018-04-21 ENCOUNTER — Institutional Professional Consult (permissible substitution): Admit: 2018-04-21 | Discharge: 2018-04-22

## 2018-04-21 DIAGNOSIS — Z202 Contact with and (suspected) exposure to infections with a predominantly sexual mode of transmission: Principal | ICD-10-CM

## 2018-04-21 DIAGNOSIS — Z113 Encounter for screening for infections with a predominantly sexual mode of transmission: Secondary | ICD-10-CM

## 2018-05-27 ENCOUNTER — Ambulatory Visit: Admit: 2018-05-27 | Discharge: 2018-05-28 | Attending: Infectious Disease | Primary: Infectious Disease

## 2018-05-27 DIAGNOSIS — Z880 Allergy status to penicillin: Secondary | ICD-10-CM

## 2018-05-27 DIAGNOSIS — B20 Human immunodeficiency virus [HIV] disease: Secondary | ICD-10-CM

## 2018-05-27 DIAGNOSIS — F409 Phobic anxiety disorder, unspecified: Secondary | ICD-10-CM

## 2018-05-27 DIAGNOSIS — K210 Gastro-esophageal reflux disease with esophagitis: Secondary | ICD-10-CM

## 2018-05-27 DIAGNOSIS — Z21 Asymptomatic human immunodeficiency virus [HIV] infection status: Principal | ICD-10-CM

## 2018-05-27 DIAGNOSIS — F5105 Insomnia due to other mental disorder: Secondary | ICD-10-CM

## 2018-05-27 DIAGNOSIS — J45909 Unspecified asthma, uncomplicated: Secondary | ICD-10-CM

## 2018-05-27 DIAGNOSIS — E213 Hyperparathyroidism, unspecified: Secondary | ICD-10-CM

## 2018-05-27 MED ORDER — ALBUTEROL SULFATE HFA 90 MCG/ACTUATION AEROSOL INHALER
Freq: Four times a day (QID) | RESPIRATORY_TRACT | 4 refills | 0 days | Status: CP | PRN
Start: 2018-05-27 — End: 2018-11-25

## 2018-05-27 MED ORDER — FLUTICASONE PROPIONATE 44 MCG/ACTUATION HFA AEROSOL INHALER
Freq: Two times a day (BID) | RESPIRATORY_TRACT | 0 refills | 0.00000 days | Status: CP
Start: 2018-05-27 — End: 2018-11-25

## 2018-05-27 MED ORDER — TRAZODONE 50 MG TABLET
ORAL_TABLET | Freq: Every evening | ORAL | 11 refills | 0.00000 days | Status: CP
Start: 2018-05-27 — End: 2018-11-25

## 2018-05-27 MED ORDER — FAMOTIDINE 40 MG TABLET
ORAL_TABLET | Freq: Every evening | ORAL | 3 refills | 0 days | Status: CP | PRN
Start: 2018-05-27 — End: 2018-11-25

## 2018-05-27 MED ORDER — ABACAVIR 600 MG-DOLUTEGRAVIR 50 MG-LAMIVUDINE 300 MG TABLET
ORAL_TABLET | ORAL | 11 refills | 0 days | Status: CP
Start: 2018-05-27 — End: 2018-11-25

## 2018-07-20 ENCOUNTER — Encounter
Admit: 2018-07-20 | Discharge: 2018-07-20 | Disposition: A | Discharge status: Home / Self Care | Payer: PRIVATE HEALTH INSURANCE

## 2018-07-20 DIAGNOSIS — R6889 Other general symptoms and signs: Principal | ICD-10-CM

## 2018-07-20 MED ORDER — HYDROCODONE-HOMATROPINE 5 MG-1.5 MG/5 ML ORAL SYRUP
Freq: Four times a day (QID) | ORAL | 0 refills | 0.00000 days | Status: CP | PRN
Start: 2018-07-20 — End: 2018-07-25

## 2018-07-20 MED ORDER — HYDROCODONE-HOMATROPINE 5 MG-1.5 MG/5 ML ORAL SYRUP: 5 mL | mL | Freq: Four times a day (QID) | 0 refills | 0 days | Status: AC

## 2018-07-20 MED ORDER — XOFLUZA 40 MG TABLET
ORAL_TABLET | Freq: Once | ORAL | 0 refills | 0.00000 days | Status: CP
Start: 2018-07-20 — End: 2018-07-20

## 2018-11-25 ENCOUNTER — Institutional Professional Consult (permissible substitution): Admit: 2018-11-25 | Discharge: 2018-11-26 | Attending: Infectious Disease | Primary: Infectious Disease

## 2018-11-25 DIAGNOSIS — N521 Erectile dysfunction due to diseases classified elsewhere: Secondary | ICD-10-CM

## 2018-11-25 DIAGNOSIS — F409 Phobic anxiety disorder, unspecified: Secondary | ICD-10-CM

## 2018-11-25 DIAGNOSIS — B20 Human immunodeficiency virus [HIV] disease: Principal | ICD-10-CM

## 2018-11-25 DIAGNOSIS — J45909 Unspecified asthma, uncomplicated: Secondary | ICD-10-CM

## 2018-11-25 DIAGNOSIS — F5105 Insomnia due to other mental disorder: Secondary | ICD-10-CM

## 2018-11-25 MED ORDER — TRAZODONE 50 MG TABLET
ORAL_TABLET | Freq: Every evening | ORAL | 11 refills | 0 days | Status: CP
Start: 2018-11-25 — End: 2019-11-20

## 2018-11-25 MED ORDER — ALBUTEROL SULFATE HFA 90 MCG/ACTUATION AEROSOL INHALER
Freq: Four times a day (QID) | RESPIRATORY_TRACT | 4 refills | 0.00000 days | Status: CP | PRN
Start: 2018-11-25 — End: 2019-11-25
  Filled 2018-11-25: qty 18, 25d supply, fill #0

## 2018-11-25 MED ORDER — FLUTICASONE PROPIONATE 44 MCG/ACTUATION HFA AEROSOL INHALER
Freq: Two times a day (BID) | RESPIRATORY_TRACT | 0 refills | 0.00000 days | Status: CP
Start: 2018-11-25 — End: 2019-11-25
  Filled 2018-11-25: qty 10.6, 30d supply, fill #0

## 2018-11-25 MED ORDER — TADALAFIL 5 MG TABLET
ORAL_TABLET | Freq: Every day | ORAL | 3 refills | 0.00000 days | Status: CP | PRN
Start: 2018-11-25 — End: 2018-12-27
  Filled 2018-11-26: qty 8, 8d supply, fill #0

## 2018-11-25 MED ORDER — ABACAVIR 600 MG-DOLUTEGRAVIR 50 MG-LAMIVUDINE 300 MG TABLET
ORAL_TABLET | ORAL | 11 refills | 0.00000 days | Status: CP
Start: 2018-11-25 — End: ?

## 2018-11-25 MED FILL — FLOVENT HFA 44 MCG/ACTUATION AEROSOL INHALER: 30 days supply | Qty: 11 | Fill #0 | Status: AC

## 2018-11-25 MED FILL — ALBUTEROL SULFATE HFA 90 MCG/ACTUATION AEROSOL INHALER: 25 days supply | Qty: 18 | Fill #0 | Status: AC

## 2018-11-26 MED FILL — TADALAFIL 5 MG TABLET: 8 days supply | Qty: 8 | Fill #0 | Status: AC

## 2019-04-21 DIAGNOSIS — B20 Human immunodeficiency virus [HIV] disease: Principal | ICD-10-CM

## 2019-04-21 DIAGNOSIS — Z113 Encounter for screening for infections with a predominantly sexual mode of transmission: Principal | ICD-10-CM

## 2019-04-22 ENCOUNTER — Encounter: Admit: 2019-04-22 | Discharge: 2019-04-23 | Payer: PRIVATE HEALTH INSURANCE

## 2019-04-22 DIAGNOSIS — B20 Human immunodeficiency virus [HIV] disease: Principal | ICD-10-CM

## 2019-04-22 DIAGNOSIS — Z113 Encounter for screening for infections with a predominantly sexual mode of transmission: Principal | ICD-10-CM

## 2019-04-28 ENCOUNTER — Encounter
Admit: 2019-04-28 | Discharge: 2019-04-28 | Payer: PRIVATE HEALTH INSURANCE | Attending: Infectious Disease | Primary: Infectious Disease

## 2019-04-28 DIAGNOSIS — N521 Erectile dysfunction due to diseases classified elsewhere: Principal | ICD-10-CM

## 2019-04-28 DIAGNOSIS — F409 Phobic anxiety disorder, unspecified: Principal | ICD-10-CM

## 2019-04-28 DIAGNOSIS — B20 Human immunodeficiency virus [HIV] disease: Principal | ICD-10-CM

## 2019-04-28 DIAGNOSIS — F5105 Insomnia due to other mental disorder: Principal | ICD-10-CM

## 2019-04-28 MED ORDER — TADALAFIL 5 MG TABLET
ORAL_TABLET | Freq: Every day | ORAL | 3 refills | 10.00000 days | Status: CP | PRN
Start: 2019-04-28 — End: ?
  Filled 2019-05-31: qty 8, 8d supply, fill #1

## 2019-04-28 MED ORDER — DOLUTEGRAVIR 50 MG-LAMIVUDINE 300 MG TABLET
ORAL_TABLET | Freq: Every day | ORAL | 11 refills | 0 days | Status: CP
Start: 2019-04-28 — End: ?

## 2019-04-28 MED ORDER — TRAZODONE 50 MG TABLET
ORAL_TABLET | Freq: Every evening | ORAL | 11 refills | 30 days | Status: CP
Start: 2019-04-28 — End: 2020-04-22

## 2019-05-31 MED FILL — TADALAFIL 5 MG TABLET: 8 days supply | Qty: 8 | Fill #1 | Status: AC

## 2019-06-29 ENCOUNTER — Ambulatory Visit: Admit: 2019-06-29 | Discharge: 2019-06-30

## 2019-06-29 DIAGNOSIS — Z20822 Exposure to COVID-19 virus: Principal | ICD-10-CM

## 2019-09-10 ENCOUNTER — Encounter
Admit: 2019-09-10 | Discharge: 2019-09-11 | Payer: PRIVATE HEALTH INSURANCE | Attending: Physician Assistant | Primary: Physician Assistant

## 2019-09-10 DIAGNOSIS — R509 Fever, unspecified: Principal | ICD-10-CM

## 2019-09-10 DIAGNOSIS — J069 Acute upper respiratory infection, unspecified: Principal | ICD-10-CM

## 2019-09-10 DIAGNOSIS — R52 Pain, unspecified: Principal | ICD-10-CM

## 2019-09-15 MED FILL — TADALAFIL 5 MG TABLET: ORAL | 8 days supply | Qty: 8 | Fill #2

## 2019-09-15 MED FILL — TADALAFIL 5 MG TABLET: 8 days supply | Qty: 8 | Fill #2 | Status: AC

## 2019-10-06 DIAGNOSIS — K210 Gastro-esophageal reflux disease with esophagitis: Principal | ICD-10-CM

## 2019-10-09 MED ORDER — FAMOTIDINE 40 MG TABLET
ORAL_TABLET | Freq: Every evening | ORAL | 3 refills | 90 days | Status: CP | PRN
Start: 2019-10-09 — End: 2020-10-03

## 2019-10-27 ENCOUNTER — Ambulatory Visit
Admit: 2019-10-27 | Discharge: 2019-10-28 | Payer: PRIVATE HEALTH INSURANCE | Attending: Infectious Disease | Primary: Infectious Disease

## 2019-10-27 DIAGNOSIS — F409 Phobic anxiety disorder, unspecified: Principal | ICD-10-CM

## 2019-10-27 DIAGNOSIS — N521 Erectile dysfunction due to diseases classified elsewhere: Principal | ICD-10-CM

## 2019-10-27 DIAGNOSIS — B20 Human immunodeficiency virus [HIV] disease: Principal | ICD-10-CM

## 2019-10-27 DIAGNOSIS — F5105 Insomnia due to other mental disorder: Principal | ICD-10-CM

## 2019-10-27 DIAGNOSIS — Z21 Asymptomatic human immunodeficiency virus [HIV] infection status: Principal | ICD-10-CM

## 2019-10-27 MED ORDER — TADALAFIL 5 MG TABLET
ORAL_TABLET | Freq: Every day | ORAL | 3 refills | 10 days | Status: CP | PRN
Start: 2019-10-27 — End: ?

## 2019-10-27 MED ORDER — DOLUTEGRAVIR 50 MG-LAMIVUDINE 300 MG TABLET
ORAL_TABLET | Freq: Every day | ORAL | 11 refills | 0 days | Status: CP
Start: 2019-10-27 — End: ?

## 2019-10-27 MED ORDER — TRAZODONE 50 MG TABLET
ORAL_TABLET | Freq: Every evening | ORAL | 11 refills | 30.00000 days | Status: CP
Start: 2019-10-27 — End: 2020-10-21

## 2019-10-31 DIAGNOSIS — Z21 Asymptomatic human immunodeficiency virus [HIV] infection status: Principal | ICD-10-CM

## 2019-11-09 ENCOUNTER — Encounter
Admit: 2019-11-09 | Discharge: 2019-11-10 | Discharge status: Against Medical Advice | Payer: PRIVATE HEALTH INSURANCE | Attending: Emergency Medicine

## 2019-11-11 ENCOUNTER — Encounter: Admit: 2019-11-11 | Discharge: 2019-11-12 | Payer: PRIVATE HEALTH INSURANCE

## 2019-11-11 DIAGNOSIS — Z21 Asymptomatic human immunodeficiency virus [HIV] infection status: Principal | ICD-10-CM

## 2019-11-13 DIAGNOSIS — Z21 Asymptomatic human immunodeficiency virus [HIV] infection status: Principal | ICD-10-CM

## 2020-01-15 ENCOUNTER — Ambulatory Visit
Admit: 2020-01-15 | Discharge: 2020-01-16 | Payer: PRIVATE HEALTH INSURANCE | Attending: Physician Assistant | Primary: Physician Assistant

## 2020-01-15 DIAGNOSIS — R369 Urethral discharge, unspecified: Principal | ICD-10-CM

## 2020-01-15 MED ORDER — DOXYCYCLINE HYCLATE 100 MG CAPSULE
ORAL_CAPSULE | Freq: Two times a day (BID) | ORAL | 0 refills | 7.00000 days | Status: CP
Start: 2020-01-15 — End: 2020-01-22

## 2020-02-02 ENCOUNTER — Ambulatory Visit
Admit: 2020-02-02 | Discharge: 2020-02-03 | Payer: PRIVATE HEALTH INSURANCE | Attending: Student in an Organized Health Care Education/Training Program | Primary: Student in an Organized Health Care Education/Training Program

## 2020-02-02 DIAGNOSIS — R079 Chest pain, unspecified: Principal | ICD-10-CM

## 2020-02-02 DIAGNOSIS — F419 Anxiety disorder, unspecified: Principal | ICD-10-CM

## 2020-02-02 MED ORDER — PAROXETINE 20 MG TABLET
ORAL_TABLET | Freq: Every day | ORAL | 1 refills | 30.00000 days | Status: CP
Start: 2020-02-02 — End: 2020-04-02

## 2020-02-02 MED ORDER — PROPRANOLOL 10 MG TABLET
ORAL_TABLET | Freq: Two times a day (BID) | ORAL | 11 refills | 30.00000 days | Status: CP | PRN
Start: 2020-02-02 — End: 2021-02-01

## 2020-02-09 ENCOUNTER — Ambulatory Visit
Admit: 2020-02-09 | Discharge: 2020-02-10 | Payer: PRIVATE HEALTH INSURANCE | Attending: Infectious Disease | Primary: Infectious Disease

## 2020-02-09 DIAGNOSIS — K21 Reflux esophagitis: Principal | ICD-10-CM

## 2020-02-09 DIAGNOSIS — F5105 Insomnia due to other mental disorder: Secondary | ICD-10-CM

## 2020-02-09 DIAGNOSIS — J45909 Unspecified asthma, uncomplicated: Principal | ICD-10-CM

## 2020-02-09 DIAGNOSIS — F409 Phobic anxiety disorder, unspecified: Principal | ICD-10-CM

## 2020-02-09 DIAGNOSIS — N521 Erectile dysfunction due to diseases classified elsewhere: Principal | ICD-10-CM

## 2020-02-09 DIAGNOSIS — E213 Hyperparathyroidism, unspecified: Principal | ICD-10-CM

## 2020-02-09 DIAGNOSIS — B20 Human immunodeficiency virus [HIV] disease: Principal | ICD-10-CM

## 2020-02-09 MED ORDER — ALBUTEROL SULFATE HFA 90 MCG/ACTUATION AEROSOL INHALER
Freq: Four times a day (QID) | RESPIRATORY_TRACT | 4 refills | 25.00000 days | Status: CP | PRN
Start: 2020-02-09 — End: 2021-02-08

## 2020-02-09 MED ORDER — FAMOTIDINE 40 MG TABLET
ORAL_TABLET | Freq: Every evening | ORAL | 3 refills | 90 days | Status: CP | PRN
Start: 2020-02-09 — End: 2021-02-03

## 2020-02-09 MED ORDER — PROPRANOLOL 10 MG TABLET
ORAL_TABLET | Freq: Two times a day (BID) | ORAL | 11 refills | 30.00000 days | Status: CP | PRN
Start: 2020-02-09 — End: 2021-02-08

## 2020-02-09 MED ORDER — TADALAFIL 5 MG TABLET
ORAL_TABLET | Freq: Every day | ORAL | 3 refills | 10.00000 days | Status: CP | PRN
Start: 2020-02-09 — End: ?

## 2020-02-09 MED ORDER — TRAZODONE 50 MG TABLET
ORAL_TABLET | Freq: Every evening | ORAL | 11 refills | 30.00000 days | Status: CP
Start: 2020-02-09 — End: 2021-02-03

## 2020-02-09 MED ORDER — PROMETHAZINE 25 MG TABLET
ORAL_TABLET | Freq: Four times a day (QID) | ORAL | 6 refills | 8.00000 days | Status: CP | PRN
Start: 2020-02-09 — End: ?

## 2020-02-09 MED ORDER — DOLUTEGRAVIR 50 MG-LAMIVUDINE 300 MG TABLET
ORAL_TABLET | Freq: Every day | ORAL | 11 refills | 0 days | Status: CP
Start: 2020-02-09 — End: ?

## 2020-02-21 ENCOUNTER — Ambulatory Visit: Admit: 2020-02-21 | Discharge: 2020-02-21 | Payer: PRIVATE HEALTH INSURANCE

## 2020-02-21 ENCOUNTER — Ambulatory Visit
Admit: 2020-02-21 | Discharge: 2020-02-21 | Payer: PRIVATE HEALTH INSURANCE | Attending: Physician Assistant | Primary: Physician Assistant

## 2020-02-21 DIAGNOSIS — H5711 Ocular pain, right eye: Principal | ICD-10-CM

## 2020-02-22 ENCOUNTER — Ambulatory Visit: Admit: 2020-02-22 | Discharge: 2020-02-23 | Payer: PRIVATE HEALTH INSURANCE

## 2020-03-02 ENCOUNTER — Ambulatory Visit: Admit: 2020-03-02 | Discharge: 2020-03-03 | Payer: PRIVATE HEALTH INSURANCE

## 2020-03-02 DIAGNOSIS — R079 Chest pain, unspecified: Principal | ICD-10-CM

## 2020-03-02 DIAGNOSIS — R1031 Right lower quadrant pain: Principal | ICD-10-CM

## 2020-03-02 DIAGNOSIS — B20 Human immunodeficiency virus [HIV] disease: Principal | ICD-10-CM

## 2020-03-02 DIAGNOSIS — F419 Anxiety disorder, unspecified: Principal | ICD-10-CM

## 2020-03-02 DIAGNOSIS — G43709 Chronic migraine without aura, not intractable, without status migrainosus: Principal | ICD-10-CM

## 2020-03-02 MED ORDER — AMITRIPTYLINE 10 MG TABLET
ORAL_TABLET | Freq: Every evening | ORAL | 11 refills | 30.00000 days | Status: CP
Start: 2020-03-02 — End: 2021-03-02

## 2020-03-07 DIAGNOSIS — N521 Erectile dysfunction due to diseases classified elsewhere: Principal | ICD-10-CM

## 2020-03-07 MED ORDER — TADALAFIL 5 MG TABLET
ORAL_TABLET | Freq: Every day | ORAL | 3 refills | 10.00000 days | Status: CP | PRN
Start: 2020-03-07 — End: 2020-06-06

## 2020-03-14 DIAGNOSIS — N521 Erectile dysfunction due to diseases classified elsewhere: Principal | ICD-10-CM

## 2020-03-15 MED ORDER — TADALAFIL 5 MG TABLET
ORAL_TABLET | Freq: Every day | ORAL | 3 refills | 10.00000 days | Status: CP | PRN
Start: 2020-03-15 — End: 2020-06-14

## 2020-03-28 DIAGNOSIS — B20 Human immunodeficiency virus [HIV] disease: Principal | ICD-10-CM

## 2020-03-29 ENCOUNTER — Ambulatory Visit: Admit: 2020-03-29 | Discharge: 2020-03-30 | Payer: PRIVATE HEALTH INSURANCE

## 2020-04-18 MED ORDER — PAROXETINE 20 MG TABLET
ORAL_TABLET | ORAL | 2 refills | 0.00000 days | Status: CP
Start: 2020-04-18 — End: ?

## 2020-05-02 ENCOUNTER — Ambulatory Visit: Admit: 2020-05-02 | Discharge: 2020-05-02 | Payer: PRIVATE HEALTH INSURANCE

## 2020-05-02 DIAGNOSIS — E213 Hyperparathyroidism, unspecified: Principal | ICD-10-CM

## 2020-05-02 DIAGNOSIS — R1031 Right lower quadrant pain: Principal | ICD-10-CM

## 2020-05-02 DIAGNOSIS — F419 Anxiety disorder, unspecified: Principal | ICD-10-CM

## 2020-05-02 DIAGNOSIS — G43709 Chronic migraine without aura, not intractable, without status migrainosus: Principal | ICD-10-CM

## 2020-05-02 DIAGNOSIS — R369 Urethral discharge, unspecified: Principal | ICD-10-CM

## 2020-05-02 MED ORDER — AMITRIPTYLINE 25 MG TABLET
ORAL_TABLET | Freq: Every evening | ORAL | 11 refills | 30 days | Status: CP
Start: 2020-05-02 — End: 2020-08-02

## 2020-05-03 ENCOUNTER — Ambulatory Visit
Admit: 2020-05-03 | Discharge: 2020-05-04 | Payer: PRIVATE HEALTH INSURANCE | Attending: Family Medicine | Primary: Family Medicine

## 2020-05-03 DIAGNOSIS — A549 Gonococcal infection, unspecified: Principal | ICD-10-CM

## 2020-05-13 ENCOUNTER — Ambulatory Visit: Admit: 2020-05-13 | Discharge: 2020-05-14 | Payer: PRIVATE HEALTH INSURANCE

## 2020-05-13 ENCOUNTER — Ambulatory Visit: Admit: 2020-05-13 | Discharge: 2020-05-14 | Payer: PRIVATE HEALTH INSURANCE | Attending: Family | Primary: Family

## 2020-05-13 DIAGNOSIS — R509 Fever, unspecified: Principal | ICD-10-CM

## 2020-05-13 DIAGNOSIS — U071 2019 novel coronavirus disease (COVID-19): Principal | ICD-10-CM

## 2020-05-13 DIAGNOSIS — J029 Acute pharyngitis, unspecified: Principal | ICD-10-CM

## 2020-05-13 DIAGNOSIS — R059 Cough: Principal | ICD-10-CM

## 2020-05-15 DIAGNOSIS — U071 COVID-19: Principal | ICD-10-CM

## 2020-05-17 DIAGNOSIS — U071 COVID-19: Principal | ICD-10-CM

## 2020-07-30 DIAGNOSIS — R9431 Abnormal electrocardiogram [ECG] [EKG]: Principal | ICD-10-CM

## 2020-08-02 ENCOUNTER — Institutional Professional Consult (permissible substitution): Admit: 2020-08-02 | Discharge: 2020-08-02 | Payer: PRIVATE HEALTH INSURANCE

## 2020-08-02 ENCOUNTER — Ambulatory Visit
Admit: 2020-08-02 | Discharge: 2020-08-02 | Payer: PRIVATE HEALTH INSURANCE | Attending: Infectious Disease | Primary: Infectious Disease

## 2020-08-02 DIAGNOSIS — J45909 Unspecified asthma, uncomplicated: Principal | ICD-10-CM

## 2020-08-02 DIAGNOSIS — F409 Phobic anxiety disorder, unspecified: Principal | ICD-10-CM

## 2020-08-02 DIAGNOSIS — B20 Human immunodeficiency virus [HIV] disease: Principal | ICD-10-CM

## 2020-08-02 DIAGNOSIS — R079 Chest pain, unspecified: Principal | ICD-10-CM

## 2020-08-02 DIAGNOSIS — K2100 Gastro-esophageal reflux disease with esophagitis, without bleeding: Principal | ICD-10-CM

## 2020-08-02 DIAGNOSIS — N521 Erectile dysfunction due to diseases classified elsewhere: Principal | ICD-10-CM

## 2020-08-02 DIAGNOSIS — G43709 Chronic migraine without aura, not intractable, without status migrainosus: Principal | ICD-10-CM

## 2020-08-02 DIAGNOSIS — F5105 Insomnia due to other mental disorder: Principal | ICD-10-CM

## 2020-08-02 MED ORDER — ALBUTEROL SULFATE HFA 90 MCG/ACTUATION AEROSOL INHALER
Freq: Four times a day (QID) | RESPIRATORY_TRACT | 4 refills | 25.00000 days | Status: CP | PRN
Start: 2020-08-02 — End: 2021-08-02

## 2020-08-02 MED ORDER — PROMETHAZINE 25 MG TABLET
ORAL_TABLET | Freq: Four times a day (QID) | ORAL | 6 refills | 8 days | Status: CP | PRN
Start: 2020-08-02 — End: ?

## 2020-08-02 MED ORDER — TADALAFIL 5 MG TABLET
ORAL_TABLET | Freq: Every day | ORAL | 3 refills | 8.00000 days | Status: CP | PRN
Start: 2020-08-02 — End: ?

## 2020-08-02 MED ORDER — AMITRIPTYLINE 25 MG TABLET
ORAL_TABLET | Freq: Every evening | ORAL | 11 refills | 30 days | Status: CP
Start: 2020-08-02 — End: 2021-08-02

## 2020-08-02 MED ORDER — PAROXETINE 20 MG TABLET
ORAL_TABLET | Freq: Every day | ORAL | 2 refills | 90 days | Status: CP
Start: 2020-08-02 — End: ?

## 2020-08-02 MED ORDER — FAMOTIDINE 40 MG TABLET
ORAL_TABLET | Freq: Every evening | ORAL | 3 refills | 90 days | Status: CP | PRN
Start: 2020-08-02 — End: 2021-07-28

## 2020-08-02 MED ORDER — TRAZODONE 50 MG TABLET
ORAL_TABLET | Freq: Every evening | ORAL | 11 refills | 30 days | Status: CP
Start: 2020-08-02 — End: 2021-07-28

## 2020-08-02 MED ORDER — DOLUTEGRAVIR 50 MG-LAMIVUDINE 300 MG TABLET
ORAL_TABLET | Freq: Every day | ORAL | 11 refills | 0.00000 days | Status: CP
Start: 2020-08-02 — End: ?

## 2020-08-02 MED ORDER — PROPRANOLOL 10 MG TABLET
ORAL_TABLET | Freq: Two times a day (BID) | ORAL | 11 refills | 30.00000 days | Status: CP | PRN
Start: 2020-08-02 — End: 2021-08-02

## 2020-08-28 ENCOUNTER — Ambulatory Visit: Admit: 2020-08-28 | Discharge: 2020-08-29 | Payer: PRIVATE HEALTH INSURANCE

## 2020-08-28 DIAGNOSIS — E213 Hyperparathyroidism, unspecified: Principal | ICD-10-CM

## 2020-08-28 DIAGNOSIS — E559 Vitamin D deficiency, unspecified: Principal | ICD-10-CM

## 2020-08-31 ENCOUNTER — Ambulatory Visit: Admit: 2020-08-31 | Discharge: 2020-09-01 | Payer: PRIVATE HEALTH INSURANCE

## 2020-08-31 DIAGNOSIS — E213 Hyperparathyroidism, unspecified: Principal | ICD-10-CM

## 2020-09-04 DIAGNOSIS — E213 Hyperparathyroidism, unspecified: Principal | ICD-10-CM

## 2020-09-04 DIAGNOSIS — E559 Vitamin D deficiency, unspecified: Principal | ICD-10-CM

## 2020-10-10 ENCOUNTER — Ambulatory Visit: Admit: 2020-10-10 | Discharge: 2020-10-11 | Payer: PRIVATE HEALTH INSURANCE

## 2020-10-24 ENCOUNTER — Ambulatory Visit: Admit: 2020-10-24 | Discharge: 2020-10-25 | Payer: PRIVATE HEALTH INSURANCE

## 2020-10-24 DIAGNOSIS — E559 Vitamin D deficiency, unspecified: Principal | ICD-10-CM

## 2020-10-24 DIAGNOSIS — E213 Hyperparathyroidism, unspecified: Principal | ICD-10-CM

## 2020-11-07 ENCOUNTER — Ambulatory Visit: Admit: 2020-11-07 | Discharge: 2020-11-08 | Payer: PRIVATE HEALTH INSURANCE

## 2020-11-07 DIAGNOSIS — J4520 Mild intermittent asthma, uncomplicated: Principal | ICD-10-CM

## 2020-11-07 DIAGNOSIS — R42 Dizziness and giddiness: Principal | ICD-10-CM

## 2020-11-07 DIAGNOSIS — E213 Hyperparathyroidism, unspecified: Principal | ICD-10-CM

## 2020-11-07 DIAGNOSIS — Z113 Encounter for screening for infections with a predominantly sexual mode of transmission: Principal | ICD-10-CM

## 2020-11-07 DIAGNOSIS — R85612 Low grade squamous intraepithelial lesion on cytologic smear of anus (LGSIL): Principal | ICD-10-CM

## 2020-11-07 DIAGNOSIS — G43709 Chronic migraine without aura, not intractable, without status migrainosus: Principal | ICD-10-CM

## 2020-11-07 DIAGNOSIS — B20 Human immunodeficiency virus [HIV] disease: Principal | ICD-10-CM

## 2020-11-07 MED ORDER — AMITRIPTYLINE 50 MG TABLET
ORAL_TABLET | Freq: Every evening | ORAL | 11 refills | 30.00000 days | Status: CP
Start: 2020-11-07 — End: 2021-11-07

## 2020-11-09 ENCOUNTER — Institutional Professional Consult (permissible substitution): Admit: 2020-11-09 | Discharge: 2020-11-10 | Payer: PRIVATE HEALTH INSURANCE

## 2020-11-17 ENCOUNTER — Ambulatory Visit: Admit: 2020-11-17 | Discharge: 2020-11-18 | Payer: PRIVATE HEALTH INSURANCE

## 2020-11-17 DIAGNOSIS — L309 Dermatitis, unspecified: Principal | ICD-10-CM

## 2020-11-17 DIAGNOSIS — K6289 Other specified diseases of anus and rectum: Principal | ICD-10-CM

## 2020-11-17 DIAGNOSIS — R3589 Polyuria: Principal | ICD-10-CM

## 2020-11-17 MED ORDER — TRIAMCINOLONE ACETONIDE 0.1 % TOPICAL OINTMENT
Freq: Two times a day (BID) | TOPICAL | 1 refills | 0 days | Status: CP
Start: 2020-11-17 — End: 2021-11-17

## 2020-12-07 ENCOUNTER — Ambulatory Visit: Admit: 2020-12-07 | Payer: PRIVATE HEALTH INSURANCE

## 2021-01-15 ENCOUNTER — Ambulatory Visit: Admit: 2021-01-15 | Payer: PRIVATE HEALTH INSURANCE

## 2021-01-17 ENCOUNTER — Ambulatory Visit
Admit: 2021-01-17 | Discharge: 2021-01-17 | Disposition: A | Discharge status: Home / Self Care | Payer: PRIVATE HEALTH INSURANCE

## 2021-01-17 ENCOUNTER — Emergency Department
Admit: 2021-01-17 | Discharge: 2021-01-17 | Disposition: A | Discharge status: Home / Self Care | Payer: PRIVATE HEALTH INSURANCE

## 2021-01-17 DIAGNOSIS — R079 Chest pain, unspecified: Principal | ICD-10-CM

## 2021-01-19 ENCOUNTER — Ambulatory Visit
Admit: 2021-01-19 | Discharge: 2021-01-20 | Payer: PRIVATE HEALTH INSURANCE | Attending: Student in an Organized Health Care Education/Training Program | Primary: Student in an Organized Health Care Education/Training Program

## 2021-01-19 MED ORDER — OMEPRAZOLE 20 MG CAPSULE,DELAYED RELEASE
ORAL_CAPSULE | Freq: Two times a day (BID) | ORAL | 1 refills | 90 days | Status: CP
Start: 2021-01-19 — End: 2022-01-19

## 2021-02-23 ENCOUNTER — Ambulatory Visit: Admit: 2021-02-23 | Payer: PRIVATE HEALTH INSURANCE

## 2021-03-05 ENCOUNTER — Ambulatory Visit: Admit: 2021-03-05 | Payer: PRIVATE HEALTH INSURANCE

## 2021-04-24 ENCOUNTER — Ambulatory Visit: Admit: 2021-04-24 | Discharge: 2021-04-25 | Payer: PRIVATE HEALTH INSURANCE | Attending: Family | Primary: Family

## 2021-04-24 DIAGNOSIS — Z0000 Encounter for general adult medical examination without abnormal findings: Principal | ICD-10-CM

## 2021-04-24 DIAGNOSIS — B20 Human immunodeficiency virus [HIV] disease: Principal | ICD-10-CM

## 2021-04-24 DIAGNOSIS — J029 Acute pharyngitis, unspecified: Principal | ICD-10-CM

## 2021-04-24 DIAGNOSIS — Z113 Encounter for screening for infections with a predominantly sexual mode of transmission: Principal | ICD-10-CM

## 2021-04-26 DIAGNOSIS — J020 Streptococcal pharyngitis: Principal | ICD-10-CM

## 2021-04-26 DIAGNOSIS — B20 Human immunodeficiency virus [HIV] disease: Principal | ICD-10-CM

## 2021-04-26 MED ORDER — CEPHALEXIN 500 MG CAPSULE
ORAL_CAPSULE | Freq: Two times a day (BID) | ORAL | 0 refills | 10 days | Status: CP
Start: 2021-04-26 — End: 2021-05-06

## 2021-05-10 ENCOUNTER — Ambulatory Visit: Admit: 2021-05-10 | Discharge: 2021-05-11 | Payer: PRIVATE HEALTH INSURANCE

## 2021-05-10 DIAGNOSIS — B20 Human immunodeficiency virus [HIV] disease: Principal | ICD-10-CM

## 2021-05-23 ENCOUNTER — Ambulatory Visit
Admit: 2021-05-23 | Discharge: 2021-05-24 | Payer: PRIVATE HEALTH INSURANCE | Attending: Infectious Disease | Primary: Infectious Disease

## 2021-05-23 DIAGNOSIS — N521 Erectile dysfunction due to diseases classified elsewhere: Principal | ICD-10-CM

## 2021-05-23 DIAGNOSIS — Z21 Asymptomatic human immunodeficiency virus [HIV] infection status: Principal | ICD-10-CM

## 2021-05-23 MED ORDER — TADALAFIL 5 MG TABLET
ORAL_TABLET | Freq: Every day | ORAL | 3 refills | 8.00000 days | Status: CP | PRN
Start: 2021-05-23 — End: ?
  Filled 2021-07-09: qty 8, 8d supply, fill #0

## 2021-06-28 DIAGNOSIS — Z21 Asymptomatic human immunodeficiency virus [HIV] infection status: Principal | ICD-10-CM

## 2021-06-28 DIAGNOSIS — B20 Human immunodeficiency virus [HIV] disease: Principal | ICD-10-CM

## 2021-07-04 DIAGNOSIS — B20 Human immunodeficiency virus [HIV] disease: Principal | ICD-10-CM

## 2021-07-04 MED ORDER — CABOTEGRAVIR ER 600 MG/3 ML-RILPIVIRINE ER 900 MG/3ML IM SUSPENSION,ER
INTRAMUSCULAR | 0 refills | 60.00000 days | Status: CP
Start: 2021-07-04 — End: 2021-08-04
  Filled 2021-07-11: qty 6, 30d supply, fill #0

## 2021-07-05 NOTE — Unmapped (Signed)
[facility] Specialty Medication Onboarding    Specialty Medication: CABENUVA 600 mg-900 mg/3 mL extended-release injection (cabotegravir-rilpivirine)  Prior Authorization: Not Required   Financial Assistance: No - copay  <$25  Final Copay/Day Supply: $0 / 30    Insurance Restrictions: Yes - max 1 month supply     Notes to Pharmacist: n/a    The triage team has completed the benefits investigation and has determined that the patient is able to fill this medication at [facility]. Please contact the patient to complete the onboarding or follow up with the prescribing physician as needed.

## 2021-07-06 NOTE — Unmapped (Signed)
[facility]   Patient Onboarding/Medication Counseling    Mr.Flom is a 41 y.o. male with HIV who I am counseling today on initiation of therapy.  I am speaking to the patient.    Was a Nurse, learning disability used for this call? No    Verified patient's date of birth / HIPAA.    Specialty medication(s) to be sent: Infectious Disease: Cabenuva 600-900mg /2ml extended release      Non-specialty medications/supplies to be sent: n/a      Medications not needed at this time: n/a         Cabenuva IM injection Kit  (600 cabotegravir/75ml and ripilvirine 900mg /49ml)    Medication & Administration     Dosage and Administration:     **Oral Lead-in with [place] and Shane Crutch is optional**    ??? On the last day of Dovato, the initial dose will be to inject cabotegravir 600mg /3 ml intramuscularly in one gluteal muscle and ripilvirine 900mg /27ml in the other gluteal muscle or 2 cm apart in the same gluteal muscle.  ??? Continuation dose for every 2 month injections: Inject cabotegravir 600mg /12ml in one gluteal muscle and ripilvirine 900mg /35ml in the other gluteal muscle.   ??? Patients may be given Cabenuva up to 7 days before or after the date the patient is scheduled to receive monthly or bimonthly injections.    Dosing table for every 2 month dosing (Cabenuva package insert)      Dosing: Every 2 Month - CABENUVA Official HCP Website (nyxfrost.[email or web address])    Administration:     Before preparing the injections, remove CABENUVA from the refrigerator and wait at least 15 minutes to allow the medicines to come to room temperature. The vials may remain in the carton at room temperature for up to 6 hours; do not put back into the refrigerator. If not used within 6 hours, the medication must be discarded    Adherence/Missed dose instructions: [name] package insert)      Planned Missed Injections for Patients on the [facility] Dosing Schedule If a patient plans to miss a scheduled injection visit by more than 7 days, take daily oral therapy for up to 2 months to replace 1 missed injection visit. The recommended oral daily dose is one 30-mg tablet of VOCABRIA (cabotegravir) and one 25-mg tablet of EDURANT (rilpivirine). Take VOCABRIA with EDURANT at approximately the same time each day with a meal. The first dose of oral therapy should be taken approximately 2 months after the last injection dose of CABENUVA and continued until the day injection dosing is restarted. Refer to Table 4 for injection dosing recommendations. Unplanned Missed Injections for Patients on the [facility] Dosing Schedule If a scheduled every-9-month injection visit is missed or delayed by more than 7 days and oral therapy has not been taken in the interim, clinically reassess the patient to determine if resumption of injection dosing remains appropriate.If the every-35-month dosing schedule will be continued, see Table 4 for dosing recommendations      Goals of Therapy     To suppress the replication of the HIV virus such that it will be undetectable on lab tests.    Side Effects & Monitoring Parameters   ??? Pain, redness, swelling, or other reaction where the injection was given.  ??? Feeling tired or weak.  ??? Dizziness or headache.  ??? Upset stomach.  ??? Trouble sleeping.      The following side effects should be reported to the provider:    ??? Signs of an  allergic reaction, like rash; hives; itching; red, swollen, blistered, or peeling skin with or without fever; wheezing; tightness in the chest or throat; trouble breathing, swallowing, or talking; unusual hoarseness; or swelling of the mouth, face, lips, tongue, or throat.  ??? Signs of depression, suicidal thoughts, emotional ups and downs, abnormal thinking, anxiety, or lack of interest in life.  ??? Signs of liver problems like dark urine, feeling tired, not hungry, upset stomach or stomach pain, light-colored stools, throwing up, or yellow skin or eyes.  ??? Swollen gland.  ??? Bone, joint, or muscle pain.  ??? Eye irritation.  ??? Some people have had severe reactions within minutes after the injection. These include shortness of breath, feeling agitated, stomach cramps, flushing or feeling warm, sweating, numbness in the mouth, anxiety, and signs of blood pressure changes like severe headache or dizziness, passing out, or change in eyesight. Most signs went away within a few minutes after the injection. Tell your doctor if you have any of these signs or other reaction after the injection.  ??? Changes in your immune system can happen when you start taking drugs to treat HIV. If you have an infection that you did not know you had, it may show up when you take this drug. Tell your doctor right away if you have any new signs after you start this drug, even after taking it for several months. This includes signs of infection like fever, sore throat, weakness, cough, or shortness of breath.      Contraindications, Warnings, & Precautions     ??? Hypersensitivity to cabotegravir, rilpivirine, or any component of the formulation  ??? Concomitant use with uridine diphosphate-glucuronosyl transferase and/or cytochrome P450 3A enzyme inducers (anticonvulsants [eg, carbamazepine, oxcarbazepine, phenobarbital, phenytoin], antimycobacterials [eg, rifabutin, rifampin, rifapentine], systemic dexamethasone [more than a single dose], St John's wort).  ??? Residual concentrations of cabotegravir and rilpivirine may remain in the systemic circulation of patients up to 12 months or longer. It is essential to initiate an alternative, fully suppressive antiretroviral regimen no later than 1 month after the final injections of CABENUVA when dosed monthly and no later than 2 months after the final injections when dosed every 2 months. If virologic failure is suspected, prescribe an alternative regimen as soon as possible.    Drug/Food Interactions     ??? Medication list reviewed in Epic. The patient was instructed to inform the care team before taking any new medications or supplements. No drug interactions identified.   ??? Coadministration with other antiretroviral medications for the treatment of HIV-1 infection is not recommended.  ??? Drugs that induce uridine diphosphate glucuronosyltransferase (UGT)1A1 or cytochrome P450 (CYP)3A4 may decrease the plasma concentrations of the components of CABENUVA  ??? CABENUVA should be used with caution in combination with drugs with a known risk of [place].        Storage, Handling Precautions, & Disposal     ??? Store in the refrigerator  ??? The vials may remain in the carton at room temperature for up to 6 hours; do not put back into the refrigerator. If not used within 6 hours, the medication must be discarded            Current Medications (including OTC/herbals), Comorbidities and Allergies     Current Outpatient Medications   Medication Sig Dispense Refill   ??? albuterol HFA 90 mcg/actuation inhaler Inhale 2 puffs every six (6) hours as needed for wheezing. 18 g 4   ??? amitriptyline (ELAVIL) 50 MG tablet  Take 1 tablet (50 mg total) by mouth nightly. 30 tablet 11   ??? cabotegravir-rilpivirine (CABENUVA) 600 mg-900 mg/3 mL extended-release injection Inject 6 mL into the muscle every thirty (30) days for 2 doses. 12 mL 0   ??? cabotegravir-rilpivirine (CABENUVA) 600 mg-900 mg/3 mL extended-release injection Inject 6 mL into the muscle every 8 weeks. 12 mL 3   ??? multivitamin (THERAGRAN) per tablet Take 1 tablet by mouth daily.     ??? omeprazole (PRILOSEC) 20 MG capsule Take 1 capsule (20 mg total) by mouth two (2) times a day. 180 capsule 1   ??? PARoxetine (PAXIL) 20 MG tablet Take 1 tablet (20 mg total) by mouth daily. TAKE 1 TABLET(20 MG) BY MOUTH DAILY 90 tablet 2   ??? promethazine (PHENERGAN) 25 MG tablet Take 1 tablet (25 mg total) by mouth every six (6) hours as needed for nausea. 30 tablet 6   ??? propranoloL (INDERAL) 10 MG tablet Take 1 tablet (10 mg total) by mouth two (2) times a day as needed. 60 tablet 11   ??? tadalafiL (CIALIS) 5 MG tablet Take 1 tablet (5 mg total) by mouth daily as needed for erectile dysfunction for up to 32 doses. 8 tablet 3   ??? traZODone (DESYREL) 50 MG tablet Take 1 tablet (50 mg total) by mouth nightly. 30 tablet 11   ??? triamcinolone (KENALOG) 0.1 % ointment Apply topically Two (2) times a day. For 14 days and then as needed 80 g 1     No current facility-administered medications for this visit.       Allergies   Allergen Reactions   ??? Mayonnaise Swelling     Swelling to tongue   ??? Penicillins Rash      Assessment completed 05/27/18. Patient has not received any documented cephalosporins         Patient Active Problem List   Diagnosis   ??? Human immunodeficiency virus (HIV) disease (CMS-HCC)   ??? Mixed emotional features as adjustment reaction   ??? 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 Positive PPD   ??? Anxiety attack   ??? Diarrhea   ??? Hypercalcemia   ??? Hyperparathyroidism (CMS-HCC)   ??? Chest pain   ??? Mild intermittent asthma without complication   ??? Dyshidrosis   ??? COVID       Reviewed and up to date in Epic.    Appropriateness of Therapy     Acute infections noted within Epic:  No active infections  Patient reported infection: None    Is medication and dose appropriate based on diagnosis and infection status? Yes    Prescription has been clinically reviewed: Yes      Baseline Quality of Life Assessment      How many days over the past month did your HIV  keep you from your normal activities? For example, brushing your teeth or getting up in the morning. 0    Financial Information     Medication Assistance provided: None Required    Anticipated copay of $0.00 reviewed with patient. Verified delivery address.    Delivery Information     Scheduled delivery date: 07/11/21    Expected start date: to be determined by the ID Clinic    Medication will be delivered via [facility]  cl[facility] to the temporary address in [place].  This shipment will not require a signature.      Explained the services we provide  at [facility] and that each month we would call to set up refills.  Stressed importance of returning phone calls so that we could ensure they receive their medications in time each month.  Informed patient that we should be setting up refills 7-10 days prior to when they will run out of medication.  A pharmacist will reach out to perform a clinical assessment periodically.  Informed patient that a welcome packet, containing information about our pharmacy and other support services, a Notice of Privacy Practices, and a drug information handout will be sent.      The patient or caregiver noted above participated in the development of this care plan and knows that they can request review of or adjustments to the care plan at any time.      Patient or caregiver verbalized understanding of the above information as well as how to contact the pharmacy at [phone] option 4 with any questions/concerns.  The pharmacy is open Monday through Friday 8:30am-4:30pm.  A pharmacist is available 24/7 via pager to answer any clinical questions they may have.    Patient Specific Needs     - Does the patient have any physical, cognitive, or cultural barriers? No    - Does the patient have adequate living arrangements? (i.e. the ability to store and take their medication appropriately) n/a    - Did you identify any home environmental safety or security hazards? No    - Patient prefers to have medications discussed with  Patient     - Is the patient or caregiver able to read and understand education materials at a high school level or above? Yes    - Patient's primary language is  English     - Is the patient high risk? No        Roderic Palau  [facility] Specialty Pharmacist

## 2021-07-07 NOTE — Unmapped (Signed)
This patient is receiving Cabenuva as a clinic administered medication. Patient is aware of copay of $0.00. The patient prefers all future communication to occur with the clinic. This patient is being disenrolled from our specialty management program and will be added to a patient list for appropriate follow up.      Micheal Rollins. [place], [place].D.  Specialty Pharmacist  [facility]  [phone] option 4 then option 2

## 2021-07-11 DIAGNOSIS — B20 Human immunodeficiency virus [HIV] disease: Principal | ICD-10-CM

## 2021-07-12 NOTE — Unmapped (Signed)
Update on Cabenuva delivery:    Due to problems with the pharmacy dispensing system, [employer], the delivery of Micheal Rollins has been delayed.  I checked with Lowella Bandy at [facility] and was told that the medication should be delivered today, 07/12/21, by 11:30.    Corliss Skains. [place], [place].D.  Specialty Pharmacist  [facility]  [phone] option 4 then option 2

## 2021-07-26 ENCOUNTER — Institutional Professional Consult (permissible substitution): Admit: 2021-07-26 | Discharge: 2021-07-26 | Payer: PRIVATE HEALTH INSURANCE

## 2021-07-26 DIAGNOSIS — B20 Human immunodeficiency virus [HIV] disease: Principal | ICD-10-CM

## 2021-07-26 LAB — COMPREHENSIVE METABOLIC PANEL
ALBUMIN: 4.1 g/dL (ref 3.4–5.0)
ALKALINE PHOSPHATASE: 91 U/L (ref 46–116)
ALT (SGPT): 17 U/L (ref 10–49)
ANION GAP: 7 mmol/L (ref 5–14)
AST (SGOT): 27 U/L (ref <=34)
BILIRUBIN TOTAL: 1 mg/dL (ref 0.3–1.2)
BLOOD UREA NITROGEN: 6 mg/dL — ABNORMAL LOW (ref 9–23)
BUN / CREAT RATIO: 6
CALCIUM: 10.1 mg/dL (ref 8.7–10.4)
CHLORIDE: 104 mmol/L (ref 98–107)
CO2: 30 mmol/L (ref 20.0–31.0)
CREATININE: 0.97 mg/dL
EGFR CKD-EPI (2021) MALE: >90 mL/min/{1.73_m2} (ref >=60)
GLUCOSE RANDOM: 71 mg/dL (ref 70–179)
POTASSIUM: 4.5 mmol/L (ref 3.4–4.8)
PROTEIN TOTAL: 7.5 g/dL (ref 5.7–8.2)
SODIUM: 141 mmol/L (ref 135–145)

## 2021-07-26 MED ADMIN — cabotegravir-rilpivirine (CABENUVA) 600 mg-900 mg/3 mL extended-release injection 6 mL: 6 mL | INTRAMUSCULAR | @ 18:00:00 | Stop: 2021-07-26

## 2021-07-26 NOTE — Unmapped (Signed)
Patient received First dose of Cabenuva   Reviewed  patient education sheet.  States understanding the +/- 7 day rule for future scheduling   Tolerated injections well. States understanding to use ice packs the first day and warm compress the second day with experiencing  Injection site discomfort    Next appointment made for August 23 2021

## 2021-07-27 NOTE — Unmapped (Signed)
Schedule appointment with family member per Dr Brooke Dare

## 2021-08-01 ENCOUNTER — Ambulatory Visit: Admit: 2021-08-01 | Discharge: 2021-08-01 | Payer: PRIVATE HEALTH INSURANCE

## 2021-08-01 DIAGNOSIS — E559 Vitamin D deficiency, unspecified: Principal | ICD-10-CM

## 2021-08-01 DIAGNOSIS — E213 Hyperparathyroidism, unspecified: Principal | ICD-10-CM

## 2021-08-01 LAB — CALCIUM: CALCIUM: 10.9 mg/dL — ABNORMAL HIGH (ref 8.7–10.4)

## 2021-08-01 LAB — PARATHYROID HORMONE (PTH): PARATHYROID HORMONE INTACT: 87.8 pg/mL — ABNORMAL HIGH (ref 18.4–80.1)

## 2021-08-01 NOTE — Unmapped (Signed)
[facility] Endocrinology Consult    Assessment/Plan:  41 y.o. male here for evaluation of hypercalcemia and hyperparathyroidism.  1. 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  Hypercalcemia has resolved and labs at last visit revealed that the hyperparathyroidism is most likely secondary to vitamin D deficiency.  He has not been on vitamin D supplementation for multiple months.  Will check labs and hope he is able to maintain a normal PTH and calcium with vitamin D supplementation alone.  - Calcium and PTH.  - 25-D level.  - Continue vitamin D septation as listed below.    2. Vitamin D deficiency.  Likely was contributing to secondary hyperparathyroidism.  - Vitamin D 2000 units daily.  - 25-D level.    3. CKD. Unknown cause. This could be secondary to chronic hypercalcemia.    4.  Osteopenia/lower than expected bone density for age.  We will continue to monitor with DEXA scans every 5 years.  - DEXA due around 08/2025.    All tests and treatments were discussed with the patient who agreed to the plans as documented. They currently have no questions and agree to call the clinic or contact us if questions arise.    Return to clinic: 6 months.    Micheal Spittle, MD  [facility] Department of Endocrinology    Reason for Consult: Hypercalcemia and hyperparathyroidism.    Provider Requesting Consult: Brooke Dare    PCP: Jacquiline Doe, MD    History of present illness:  41 y.o. male with relevant history of HIV and hypercalcemia seen at the request of Dr. Brooke Dare in consultation for evaluation of hypercalcemia and hyperparathyroidism. Calcium and PTH history below. Currently he feels well with no complaints save some chronic neck pain. He denies kidney stones, blood in his urine recent falls or fractures or any other symptoms.    Hypercalcemia history:  He has had intermittent hypercalcemia since 2013 or 2014. He was worked up and told he had potential hyperparathyroidism with no additional workup or treatment. He denies family history of hypercalcemia. No family history of endocrine tumors. No history of kidney stones in the family. He denies history of nephrolithiasis. He has no history low trauma fractures. He has had one fracture as an adult. It was a traumatic fracture of right foot.    Interval history:  Last seen by me on 08/28/2020.  The patient returns today for follow-up for hyperparathyroidism, hypercalcemia and vitamin D deficiency.  He feels well today with no complaints save some muscle soreness in his buttocks due to recent medication injections.  Since her last visit his calcium has normalized with most of his serum calcium levels in the normal range.  It was also discovered that he had severe vitamin D deficiency which was likely the cause of his hyperparathyroidism.  He is not taking vitamin D supplementation has been on this for several months.    Past Medical History:   Diagnosis Date   ??? Abnormal reaction to tuberculin test    ??? Anxiety attack    ??? Asthma    ??? Diarrhea 02/24/2013   ??? HIV disease (CMS-HCC) 2011   ??? Hypercalcemia    ??? 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 Snores        Past Surgical History:   Procedure Laterality Date   ??? PR SIGMOIDOSCOPY FLX DX  W/COLLJ SPEC BR/WA IF PFRMD N/A 01/16/2015    Procedure: SIGMOIDOSCOPY, FLEXIBLE; DIAGNOSTIC, WITH OR WITHOUT COLLECTION OF SPECIMEN(S) BY BRUSHING OR WASHING;  Surgeon: Dewaine Conger, MD;  Location: [facility];  Service: Gastroenterology   ??? PR SIGMOIDOSCOPY FLX DX W/COLLJ SPEC BR/WA IF PFRMD Left 07/05/2015    Procedure: SIGMOIDOSCOPY, FLEXIBLE; DIAGNOSTIC, WITH OR WITHOUT COLLECTION OF SPECIMEN(S) BY BRUSHING OR WASHING;  Surgeon: Alfred Levins, MD;  Location: [facility];  Service: Gastroenterology       Family History   Problem Relation Age of Onset   ??? Diabetes Mother    ??? Hypertension Mother    ??? Diabetes Maternal Grandmother    ??? Hypertension Maternal Grandmother    ??? Hypertension Maternal Grandfather    ??? Heart disease Paternal Grandfather    ??? Asthma Son    ??? Asthma Maternal Aunt    ??? Glaucoma Neg Hx    ??? Macular degeneration Neg Hx        Social History     Socioeconomic History   ??? Marital status: Single   Tobacco Use   ??? Smoking status: Never   ??? Smokeless tobacco: Never   Vaping Use   ??? Vaping Use: Never used   Substance and Sexual Activity   ??? Alcohol use: Yes     Alcohol/week: 0.0 standard drinks     Comment: occ   ??? Drug use: No   ??? Sexual activity: Yes     Partners: Male       Review of systems:  General ROS:   10 system review of systems was performed and negative except as stated above.    Medication list:  Current Outpatient Medications on File Prior to Visit   Medication Sig Dispense Refill   ??? albuterol HFA 90 mcg/actuation inhaler Inhale 2 puffs every six (6) hours as needed for wheezing. 18 g 4   ??? amitriptyline (ELAVIL) 50 MG tablet Take 1 tablet (50 mg total) by mouth nightly. 30 tablet 11   ??? cabotegravir-rilpivirine (CABENUVA) 600 mg-900 mg/3 mL extended-release injection Inject 6 mL into the muscle every thirty (30) days for 2 doses. 12 mL 0   ??? cabotegravir-rilpivirine (CABENUVA) 600 mg-900 mg/3 mL extended-release injection Inject 6 mL into the muscle every 8 weeks. 12 mL 3   ??? multivitamin (THERAGRAN) per tablet Take 1 tablet by mouth daily.     ??? omeprazole (PRILOSEC) 20 MG capsule Take 1 capsule (20 mg total) by mouth two (2) times a day. 180 capsule 1   ??? PARoxetine (PAXIL) 20 MG tablet Take 1 tablet (20 mg total) by mouth daily. TAKE 1 TABLET(20 MG) BY MOUTH DAILY 90 tablet 2   ??? promethazine (PHENERGAN) 25 MG tablet Take 1 tablet (25 mg total) by mouth every six (6) hours as needed for nausea. 30 tablet 6   ??? propranoloL (INDERAL) 10 MG tablet Take 1 tablet (10 mg total) by mouth two (2) times a day as needed. 60 tablet 11   ??? tadalafiL (CIALIS) 5 MG tablet Take 1 tablet (5 mg total) by mouth daily as needed for erectile dysfunction for up to 32 doses. 8 tablet 3   ??? triamcinolone (KENALOG) 0.1 % ointment Apply topically Two (2) times a day. For 14 days and then as needed 80 g 1   ??? traZODone (DESYREL) 50 MG tablet Take 1 tablet (50 mg total) by mouth nightly. 30 tablet 11     No  current facility-administered medications on file prior to visit.       Physical exam:  Vitals:    08/01/21 1008   BP: 116/81   Pulse: 102     Wt Readings from Last 3 Encounters:   08/01/21 83.9 kg (185 lb)   07/26/21 84.9 kg (187 lb 3.2 oz)   05/23/21 84.6 kg (186 lb 9.6 oz)     Physical Exam  Constitutional:       General: He is not in acute distress.     Appearance: Normal appearance. He is not ill-appearing or toxic-appearing.   Pulmonary:      Effort: Pulmonary effort is normal. No respiratory distress.   Neurological:      General: No focal deficit present.      Mental Status: He is alert and oriented to person, place, and time.   Psychiatric:         Mood and Affect: Mood normal.         Behavior: Behavior normal.           Labs reviewed:  Lab Results   Component Value Date    A1C 4.9 03/02/2020       Lab Results   Component Value Date    NA 141 07/26/2021    K 4.5 07/26/2021    CL 104 07/26/2021    CO2 30.0 07/26/2021    BUN 6 (L) 07/26/2021    CREATININE 0.97 07/26/2021    GFR >= 60 05/19/2012    GLU 71 07/26/2021    CALCIUM 10.1 07/26/2021    ALBUMIN 4.1 07/26/2021    PHOS 3.9 12/29/2013       Lab Results   Component Value Date    TSH 2.83 12/29/2013       Results for Micheal Rollins, Micheal Rollins (MRN [id number]) as of 08/28/2020 16:19   Ref. Range 10/27/2019 14:54 02/02/2020 10:43 02/09/2020 16:33 08/02/2020 16:05   Calcium Latest Ref Range: 8.7 - 10.4 mg/dL 81.1 91.4 (H) 78.2 95.6 (H)   Results for Micheal Rollins, Micheal Rollins (MRN [id number]) as of 08/28/2020 16:19   Ref. Range 05/31/2013 12:03 12/28/2013 13:09 08/22/2014 11:19 05/27/2018 14:10   PTH Latest Ref Range: 12.0 - 72.0 pg/mL 119 (H) 108 (H) 143 (H) 162.7 (H)      Latest Reference Range & Units 08/31/20 09:00   Calcium, 24 Hour Urine 100 - 300 mg/24hr 155.25   Calcium Urine Hrs Collected hrs 24   Calcium timed urine volume mL 2,250   Creatinine, 24 Hour Urine 800-2,000 mg/24 hrs 1,687.5   Creatinine Urine Hrs Collected hrs 24   Creatinine timed urine volume mL 2,250     Other studies reviewed:  None

## 2021-08-06 LAB — VITAMIN D 25 HYDROXY: VITAMIN D, TOTAL (25OH): 15.7 ng/mL — ABNORMAL LOW (ref 20.0–80.0)

## 2021-08-07 MED ORDER — ERGOCALCIFEROL (VITAMIN D2) 1,250 MCG (50,000 UNIT) CAPSULE
ORAL_CAPSULE | ORAL | 1 refills | 84.00000 days | Status: CP
Start: 2021-08-07 — End: 2022-08-07

## 2021-08-15 DIAGNOSIS — F409 Phobic anxiety disorder, unspecified: Principal | ICD-10-CM

## 2021-08-15 DIAGNOSIS — G43709 Chronic migraine without aura, not intractable, without status migrainosus: Principal | ICD-10-CM

## 2021-08-15 DIAGNOSIS — F5105 Insomnia due to other mental disorder: Principal | ICD-10-CM

## 2021-08-15 MED ORDER — PROMETHAZINE 25 MG TABLET
ORAL_TABLET | Freq: Four times a day (QID) | ORAL | 6 refills | 8 days | PRN
Start: 2021-08-15 — End: ?

## 2021-08-15 MED ORDER — TRAZODONE 50 MG TABLET
ORAL_TABLET | Freq: Every evening | ORAL | 11 refills | 30 days
Start: 2021-08-15 — End: 2022-08-10

## 2021-08-15 MED ORDER — PROPRANOLOL 10 MG TABLET
ORAL_TABLET | Freq: Two times a day (BID) | ORAL | 11 refills | 30.00000 days | PRN
Start: 2021-08-15 — End: 2022-08-15

## 2021-08-16 NOTE — Unmapped (Signed)
[facility] Administered Medication Refill Coordination Note      NAME:Micheal Rollins DOB: [birth year]      Medication: [name]    Day Supply: 30 days      SHIPPING      Next delivery from [facility] [phone]) to [facility] for [facility] is scheduled for 02/28.    Clinic contact: BJ Turner    Patient's next nurse visit for administration: 03/02.    We will follow up with clinic monthly for standard refill processing and delivery.      [name]  Specialty Pharmacy Technician

## 2021-08-17 MED ORDER — PROMETHAZINE 25 MG TABLET
ORAL_TABLET | Freq: Four times a day (QID) | ORAL | 2 refills | 8.00000 days | Status: CP | PRN
Start: 2021-08-17 — End: ?

## 2021-08-17 MED ORDER — PROPRANOLOL 10 MG TABLET
ORAL_TABLET | Freq: Two times a day (BID) | ORAL | 2 refills | 30 days | Status: CP | PRN
Start: 2021-08-17 — End: 2022-08-17

## 2021-08-17 MED ORDER — TRAZODONE 50 MG TABLET
ORAL_TABLET | Freq: Every evening | ORAL | 2 refills | 30 days | Status: CP
Start: 2021-08-17 — End: 2022-08-12

## 2021-08-18 NOTE — Unmapped (Signed)
Refills requested for promethazine, propranolol and trazodone.    Most recent visit 05/23/2021  Recall in for 10/20/2021    All of these medications were last prescribed by Dr. Carita Pian.    Per protocol     Trazodone 50mg  at night #30  Phenergan 25 mg prn q 6 #30  Propranolol 10 mg bid as needed #30    Each with 2 additional refills

## 2021-08-21 MED FILL — CABENUVA 600 MG/3 ML-900 MG/3 ML IM SUSPENSION, EXTENDED RELEASE: INTRAMUSCULAR | 30 days supply | Qty: 6 | Fill #1

## 2021-08-23 ENCOUNTER — Institutional Professional Consult (permissible substitution): Admit: 2021-08-23 | Discharge: 2021-08-24 | Payer: PRIVATE HEALTH INSURANCE

## 2021-08-23 DIAGNOSIS — B20 Human immunodeficiency virus [HIV] disease: Principal | ICD-10-CM

## 2021-08-23 LAB — CBC W/ AUTO DIFF
BASOPHILS ABSOLUTE COUNT: 0.1 10*9/L (ref 0.0–0.1)
BASOPHILS RELATIVE PERCENT: 1.4 %
EOSINOPHILS ABSOLUTE COUNT: 0.2 10*9/L (ref 0.0–0.5)
EOSINOPHILS RELATIVE PERCENT: 6.1 %
HEMATOCRIT: 47 % (ref 39.0–48.0)
HEMOGLOBIN: 15.9 g/dL (ref 12.9–16.5)
LYMPHOCYTES ABSOLUTE COUNT: 2.3 10*9/L (ref 1.1–3.6)
LYMPHOCYTES RELATIVE PERCENT: 58 %
MEAN CORPUSCULAR HEMOGLOBIN CONC: 33.9 g/dL (ref 32.0–36.0)
MEAN CORPUSCULAR HEMOGLOBIN: 32.4 pg (ref 25.9–32.4)
MEAN CORPUSCULAR VOLUME: 95.5 fL (ref 77.6–95.7)
MEAN PLATELET VOLUME: 9.9 fL (ref 6.8–10.7)
MONOCYTES ABSOLUTE COUNT: 0.2 10*9/L — ABNORMAL LOW (ref 0.3–0.8)
MONOCYTES RELATIVE PERCENT: 4.9 %
NEUTROPHILS ABSOLUTE COUNT: 1.2 10*9/L — ABNORMAL LOW (ref 1.8–7.8)
NEUTROPHILS RELATIVE PERCENT: 29.6 %
NUCLEATED RED BLOOD CELLS: 0 /100{WBCs} (ref <=4)
PLATELET COUNT: 236 10*9/L (ref 150–450)
RED BLOOD CELL COUNT: 4.92 10*12/L (ref 4.26–5.60)
RED CELL DISTRIBUTION WIDTH: 12 % — ABNORMAL LOW (ref 12.2–15.2)
WBC ADJUSTED: 4 10*9/L (ref 3.6–11.2)

## 2021-08-23 MED ADMIN — cabotegravir-rilpivirine (CABENUVA) 600 mg-900 mg/3 mL extended-release injection 6 mL: 6 mL | INTRAMUSCULAR | @ 19:00:00 | Stop: 2021-08-23

## 2021-08-23 NOTE — Unmapped (Signed)
Micheal Rollins here for his second set of Cabenuva injections. Stated alternate buttocks were sore for about two weeks total. Denies changes in mood or sleep. Feels like I have a life now.  Tolerated injections well. Next appt made for May 3rd with provider visit.

## 2021-08-24 LAB — LYMPH MARKER LIMITED,FLOW
ABSOLUTE CD3 CNT: 1518 {cells}/uL (ref 915–3400)
ABSOLUTE CD4 CNT: 897 {cells}/uL (ref 510–2320)
ABSOLUTE CD8 CNT: 598 {cells}/uL (ref 180–1520)
CD3% (T CELLS): 66 % (ref 61–86)
CD4% (T HELPER): 39 % (ref 34–58)
CD4:CD8 RATIO: 1.5 (ref 0.9–4.8)
CD8% T SUPPRESR: 26 % (ref 12–38)

## 2021-08-24 LAB — HIV RNA, QUANTITATIVE, PCR: HIV RNA QNT RSLT: NOT DETECTED

## 2021-09-23 DIAGNOSIS — B20 Human immunodeficiency virus [HIV] disease: Principal | ICD-10-CM

## 2021-09-27 DIAGNOSIS — G43709 Chronic migraine without aura, not intractable, without status migrainosus: Principal | ICD-10-CM

## 2021-09-27 DIAGNOSIS — J45909 Unspecified asthma, uncomplicated: Principal | ICD-10-CM

## 2021-09-27 MED ORDER — AMITRIPTYLINE 50 MG TABLET
ORAL_TABLET | Freq: Every evening | ORAL | 3 refills | 90 days | Status: CP
Start: 2021-09-27 — End: 2022-09-27

## 2021-10-03 NOTE — Unmapped (Signed)
[facility] Administered Medication Refill Coordination Note      NAME:Micheal Rollins DOB: [birth year]      Medication: [name]    Day Supply: 56 days      SHIPPING      Next delivery from [facility] [phone]) to [facility] for [facility] is scheduled for 04/27.    Clinic contact: BJ Turner    Patient's next nurse visit for administration: 05/03.    We will follow up with clinic monthly for standard refill processing and delivery.      Kenyah Luba Samella Parr  Specialty Pharmacy Technician

## 2021-10-17 MED FILL — CABENUVA 600 MG/3 ML-900 MG/3 ML IM SUSPENSION, EXTENDED RELEASE: INTRAMUSCULAR | 30 days supply | Qty: 6 | Fill #0

## 2021-10-22 NOTE — Unmapped (Signed)
Call placed to patient regarding My Chart Message Doctor king I need ur assistance asap pertaining medical issues and possible jail       No answer, LM on VM

## 2021-10-24 ENCOUNTER — Ambulatory Visit
Admit: 2021-10-24 | Discharge: 2021-10-25 | Payer: PRIVATE HEALTH INSURANCE | Attending: Infectious Disease | Primary: Infectious Disease

## 2021-10-24 DIAGNOSIS — M545 Lumbar pain: Principal | ICD-10-CM

## 2021-10-24 DIAGNOSIS — B20 Human immunodeficiency virus [HIV] disease: Principal | ICD-10-CM

## 2021-10-24 LAB — CBC W/ AUTO DIFF
BASOPHILS ABSOLUTE COUNT: 0 10*9/L (ref 0.0–0.1)
BASOPHILS RELATIVE PERCENT: 0.5 %
EOSINOPHILS ABSOLUTE COUNT: 0 10*9/L (ref 0.0–0.5)
EOSINOPHILS RELATIVE PERCENT: 1 %
HEMATOCRIT: 47.8 % (ref 39.0–48.0)
HEMOGLOBIN: 16.2 g/dL (ref 12.9–16.5)
LYMPHOCYTES ABSOLUTE COUNT: 1.9 10*9/L (ref 1.1–3.6)
LYMPHOCYTES RELATIVE PERCENT: 45.5 %
MEAN CORPUSCULAR HEMOGLOBIN CONC: 33.9 g/dL (ref 32.0–36.0)
MEAN CORPUSCULAR HEMOGLOBIN: 32.3 pg (ref 25.9–32.4)
MEAN CORPUSCULAR VOLUME: 95.3 fL (ref 77.6–95.7)
MEAN PLATELET VOLUME: 9.9 fL (ref 6.8–10.7)
MONOCYTES ABSOLUTE COUNT: 0.2 10*9/L — ABNORMAL LOW (ref 0.3–0.8)
MONOCYTES RELATIVE PERCENT: 5.9 %
NEUTROPHILS ABSOLUTE COUNT: 2 10*9/L (ref 1.8–7.8)
NEUTROPHILS RELATIVE PERCENT: 47.1 %
NUCLEATED RED BLOOD CELLS: 0 /100{WBCs} (ref <=4)
PLATELET COUNT: 258 10*9/L (ref 150–450)
RED BLOOD CELL COUNT: 5.01 10*12/L (ref 4.26–5.60)
RED CELL DISTRIBUTION WIDTH: 12.9 % (ref 12.2–15.2)
WBC ADJUSTED: 4.2 10*9/L (ref 3.6–11.2)

## 2021-10-24 LAB — BASIC METABOLIC PANEL
ANION GAP: 9 mmol/L (ref 5–14)
BLOOD UREA NITROGEN: 7 mg/dL — ABNORMAL LOW (ref 9–23)
BUN / CREAT RATIO: 6
CALCIUM: 10.7 mg/dL — ABNORMAL HIGH (ref 8.7–10.4)
CHLORIDE: 105 mmol/L (ref 98–107)
CO2: 27.3 mmol/L (ref 20.0–31.0)
CREATININE: 1.14 mg/dL — ABNORMAL HIGH
EGFR CKD-EPI (2021) MALE: 83 mL/min/{1.73_m2} (ref >=60)
GLUCOSE RANDOM: 92 mg/dL (ref 70–179)
POTASSIUM: 3.9 mmol/L (ref 3.4–4.8)
SODIUM: 141 mmol/L (ref 135–145)

## 2021-10-24 LAB — AST: AST (SGOT): 19 U/L (ref <=34)

## 2021-10-24 LAB — ALT: ALT (SGPT): 9 U/L — ABNORMAL LOW (ref 10–49)

## 2021-10-24 LAB — BILIRUBIN, TOTAL: BILIRUBIN TOTAL: 1.3 mg/dL — ABNORMAL HIGH (ref 0.3–1.2)

## 2021-10-24 MED ORDER — CABOTEGRAVIR ER 600 MG/3 ML-RILPIVIRINE ER 900 MG/3ML IM SUSPENSION,ER
INTRAMUSCULAR | 3 refills | 112 days | Status: CP
Start: 2021-10-24 — End: ?
  Filled 2021-12-05: qty 6, 34d supply, fill #0

## 2021-10-24 MED ORDER — ERGOCALCIFEROL (VITAMIN D2) 1,250 MCG (50,000 UNIT) CAPSULE
ORAL_CAPSULE | ORAL | 1 refills | 84 days
Start: 2021-10-24 — End: 2022-10-24

## 2021-10-24 MED ADMIN — cabotegravir-rilpivirine (CABENUVA) 600 mg-900 mg/3 mL extended-release injection 6 mL: 6 mL | INTRAMUSCULAR | @ 20:00:00 | Stop: 2021-10-24

## 2021-10-24 NOTE — Unmapped (Deleted)
xxx

## 2021-10-24 NOTE — Unmapped (Signed)
Pt arrived for appt.

## 2021-10-24 NOTE — Unmapped (Signed)
Referral Services Note     Duration of Intervention: 15 minutes    TYPE OF CONTACT: Face to Face - In Person    ASSESSMENT: Pt was in clinic for a follow up appointment and requested to speak with Sw staff.    INTERVENTION:  Pt expressed concerns regarding medical care and upcoming legal issues. Pt stated he will be turning himself in to the police department tomorrow 10/25/21. The pt shared his legal issues are steaming from miscommunication, lack of information, and deception. The pt received his required dose of Cabenuva and he is worried that he will become ill while in police custody. Pt states he is often sick after his injections and due to various other factors. Pt mentioned back issues, nerve pain, mental health concerns, and other symptoms.    Pt is requesting a letter for the jail. Pt's lawyer instructed him to obtain a letter from a Child psychotherapist or his medical provider. SW informed the pt that the clinic sw's are unable to help him with his request in the ID department. Sw inquired if the pt had spoken to his ID provider regarding his concerns and request. Pt did not organically share his concerns with his provider, but is open to speaking with him about his request.    PLAN: Sw shared the pt's concerns and request with his ID provider. Pt plans to speak with the provider about the request and his situation. Pt is not currently RW approved. Pt also plans to work with the benefits teams to become approved.      Sofie Rower, LCSW-A  [facility] Social Work

## 2021-10-24 NOTE — Unmapped (Signed)
Patient here for Cabenuva injections. Denies changes in mood or sleep. Tolerated well. Next appointment made for July

## 2021-10-24 NOTE — Unmapped (Deleted)
xxxx 

## 2021-10-24 NOTE — Unmapped (Addendum)
Assessment/Plan:    Micheal Rollins is a 41 y.o. male who presents to the Infectious Disease clinic    Human immunodeficiency virus (HIV) disease  -Triumeq, HIV RNA undetectable since week 4 of therapy, now Dovato since 4/21  - Last CD4 =798 Nov 2022  Undetectable Nov 2022; some blips in past  >>> switch to CAB has had shots in Feb, March, now May now every 2 months    Back pain:  Intermittent, migratory  Last imaging of spine we have is nl but from 2014. Reports he had L5 disc collapse documented elsewhere  To discuss further with Dr Brooke Dare, but I have scheduled a Ortho/Spine visit in case it is needed and asked him to obtaine imaging records or copies    PPD  - neg PPD ??+ in distant past, but borderline + Quantiferon in 2013   - treated in 2013-14 with INH and Pyr    Penile DC/ED  STD testing negative 06/12/17. Given 5 day course of Azithro.   Discharge resolved with Jan 2019 with 14 days of empirical Azithro for non-GC urethritis   Denies any sexual contact    Hypercalcemia: due to primary hyperparathryodism   -labs 12/19 stable: Ca++, Vit D, PTH  -consider surgery if worsening, currently asymptomatic or given his age to prevent long term sequelae like cardiac and bone disease  > annual f/u with Endocrine Dr Yetta Barre    Asthma -Historically had PFTs with mild, reversible obstruction. 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 I wrote a letter re his medical care for consideration by the judge in his case. (See attached document)      Subjective:      HPI: This is a follow-up visit for this 41 y.o. male.     HIV:  --started triumeq 2014, Dovato in May 2021, Cabeneuva injections 2023    Asthma:  - has not been using albuterol recently. Last used around 05/2016  - no asthma attacks  - breathrough COVID episode in November 2021, mild sx only    Primary Hyperparathyroidism:  - last two Ca+ has been 10.3 with previous at 8.9  - work up for hyperparathyroidism including scan showed no adenoma  - takes MVI  - no kidney stones ever    Anxiety:  - secondary to social issues; currently manageable off meds   - previously weaned off xanax and celexa  - Recovered from multiple deaths in family (grmother, brother)  -Seen in primary care, continued Paxil  - of note patient has J point elevation chronically on EKG (2008, 2015) and in the past presentations with chest pain/anxiety have prompted a cardiac cath (normal 2015).    LGSIL on anal pap in 2014, HRA bxp neg 05/2013  - due for pap next visit  - Today denies bleeding, change in bowel habits or pruritis    ED: No new issues. Current sexual partner is boyfriend with whom he is monogamous.   - Previously reported that he can get erection but not sustain through sex even  when there is passion and interest. 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 90 tablet 3    ergocalciferol-1,250 mcg, 50,000 unit, (DRISDOL) 1,250 mcg (50,000 unit) capsule Take 1 capsule (1,250 mcg total) by mouth once a week. 12 capsule 1    multivitamin (THERAGRAN) per tablet Take 1 tablet by mouth daily.      omeprazole (PRILOSEC) 20 MG capsule Take 1 capsule (20 mg total) by mouth two (2) times a day. 180 capsule 1    PARoxetine (PAXIL) 20 MG tablet Take 1 tablet (20 mg total) by mouth daily. TAKE 1 TABLET(20 MG) BY MOUTH DAILY 90 tablet 2    promethazine (PHENERGAN) 25 MG tablet Take 1 tablet (25 mg total) by mouth every six (6) hours as needed for nausea. 30 tablet 2    propranoloL (INDERAL) 10 MG tablet Take 1 tablet (10 mg total) by mouth two (2) times a day as needed. 60 tablet 2    tadalafiL (CIALIS) 5 MG tablet Take 1 tablet (5 mg total) by mouth daily as needed for erectile dysfunction for up to 32 doses. 8 tablet 3    traZODone (DESYREL) 50 MG tablet Take 1 tablet (50 mg total) by mouth nightly. 30 tablet 2    triamcinolone (KENALOG) 0.1 % ointment Apply topically Two (2) times a day. For 14 days and then as needed 80 g 1    albuterol HFA 90 mcg/actuation inhaler Inhale 2 puffs every six (6) hours as needed for wheezing. 18 g 4    cabotegravir-rilpivirine (CABENUVA) 600 mg-900 mg/3 mL extended-release injection Inject 6 mL into the muscle every thirty (30) days for 2 doses. 12 mL 0    cabotegravir-rilpivirine (CABENUVA) 600 mg-900 mg/3 mL extended-release injection Inject 6 mL into the muscle every 8 weeks. 12 mL 3     Current Facility-Administered Medications   Medication Dose Route Frequency Provider Last Rate Last Admin    cabotegravir-rilpivirine (CABENUVA) 600 mg-900 mg/3 mL extended-release injection 6 mL  6 mL Intramuscular Once Mickel Baas, MD        dexamethasone (DECADRON) 4 mg/mL injection 20 mg  20 mg Intravenous Once PRN Mickel Baas, MD        diphenhydrAMINE (BENADRYL) injection 25 mg  25 mg Intravenous Once PRN Mickel Baas, MD        EPINEPH[facility]) injection 0.3 mg  0.3 mg Intramuscular Once PRN Mickel Baas, MD        famotidine (PF) (PEPCID) injection 20 mg  20 mg Intravenous Once PRN Mickel Baas, MD        meperidine (DEMEROL) injection 25 mg  25 mg Intravenous Once PRN Mickel Baas, MD methylPREDNISolone sodium succinate (PF) (SOLU-medrol) injection 125 mg  125 mg Intravenous Once PRN Mickel Baas, MD  sodium chloride (NS) 0.9 % infusion  20 mL/hr Intravenous Continuous PRN Mickel Baas, MD        sodium chloride 0.9% (NS) bolus 1,000 mL  1,000 mL Intravenous Once PRN Mickel Baas, MD         Allergies: Mayonnaise and Penicillins    Social History:  Substance Use:  Neg Etoh, Tobacco, and Illicit drugs    Review of Systems:  The balance of 10 systems were negative except as noted in HPI.     Objective:      BP 122/84 (BP Site: L Arm, BP Position: Sitting, BP Cuff Size: Medium)  - Pulse 76  - Temp 36.6 ??C (97.9 ??F) (Temporal)  - Ht 175.3 cm (5' 9)  - Wt 80.6 kg (177 lb 12.8 oz)  - BMI 26.26 kg/m??      General: Well-appearing, calm, well-groomed   Eyes: PERRL. Extraocular muscles intact, sclera clear   ENT: no lesions  Neck: Supple. No thryoid or masses felt  Lymph Nodes: no LAD  Cardiovascular: RRR, S1 and S2 normal. No murmur, rub, or gallop.   Lungs: Clear to auscultation bilaterally without wheezes/crackles/rhonchi. Good air movement.   Skin: No rashes or lesions noted on limited exam.   Abdomen: Normoactive bowel sounds, abdomen soft, nontender, and nondistended, no hepatosplenomegaly or masses. Liver normal in size, no rebound or guarding   Extremities: No cyanosis, clubbing or edema.  Musculoskeletal: No joint effusions  Neuro: Alert and oriented to person, place, and time. CN II- XII intact. Normal weber and rinne. 5/5 strength in upper and lower extremities. Imbalance with standing on one foot with eyes closed. Normal balance with eyes open. No dysmetria or dysdiadochokinesia. Normal gait. No weakness of sens loss elicited  Psychiatry: Alert and oriented to person, place, and time, conversant, appropriate affect.     Labs reviewed in Epic with pertinent recent labs below:  Absolute CD4 Count (/uL)   Date Value Status   08/23/2021 897 Final   04/24/2021 798 Final   10/27/2019 496 (L) Final   10/03/2015 736 Final   07/20/2015 864 Final   07/20/2015 864 Final     HIV RNA   Date Value Status   04/24/2021 6,731 copies/mL (H) Final   11/11/2019 112 copies/mL (H) Final   10/27/2019 75,677 copies/mL (H) Final   07/20/2015 <40 Final   05/01/2015 <40 Final   01/25/2015 <40 Final     HIV RNA Log(10) (/mL)   Date Value Status   05/10/2013 <1.60 Final     HIV RNA Log10 (log copies/mL)   Date Value Status   04/24/2021 3.83 (H) Final   11/11/2019 2.05 (H) Final   10/27/2019 4.88 (H) Final      WBC   Date Value Ref Range Status   08/23/2021 4.0 3.6 - 11.2 10*9/L Final   08/22/2014 3.6 (L) 4.5 - 11.0 10*9/L Final   07/04/2010 3.6 3.5 - 10.5 k/uL Final     HGB   Date Value Ref Range Status   08/23/2021 15.9 12.9 - 16.5 g/dL Final   25/36/6440 34.7 13.5 - 17.5 g/dL Final     HCT   Date Value Ref Range Status   08/23/2021 47.0 39.0 - 48.0 % Final   08/22/2014 45.8 41.0 - 53.0 % Final     Platelet   Date Value Ref Range Status   08/23/2021 236 150 - 450 10*9/L Final   08/22/2014 188 150 - 440 10*9/L Final     Creatinine  Date Value Ref Range Status   07/26/2021 0.97 0.60 - 1.10 mg/dL Final   16/03/9603 5.40 0.70 - 1.30 mg/dL Final     AST   Date Value Ref Range Status   07/26/2021 27 <=34 U/L Final   08/22/2014 25 19 - 55 U/L Final     ALT   Date Value Ref Range Status   07/26/2021 17 10 - 49 U/L Final   08/22/2014 27 19 - 72 U/L Final     Lab Results   Component Value Date    Calcium 10.9 (H) 08/01/2021    Calcium 10.1 07/26/2021    Calcium 10.3 04/24/2021    Calcium 9.9 08/22/2014    Calcium 10.5 (H) 05/16/2014    Calcium 9.5 12/29/2013     Lab Results   Component Value Date    PTH 87.8 (H) 08/01/2021    CALCIUM 10.9 (H) 08/01/2021

## 2021-10-25 LAB — SYPHILIS SCREEN: SYPHILIS RPR SCREEN: NONREACTIVE

## 2021-10-25 MED ORDER — ERGOCALCIFEROL (VITAMIN D2) 1,250 MCG (50,000 UNIT) CAPSULE
ORAL_CAPSULE | ORAL | 1 refills | 84 days | Status: CP
Start: 2021-10-25 — End: 2022-10-25

## 2021-10-25 NOTE — Unmapped (Signed)
Patient completed [employer] application. Eligible for RW B&C grant services and Caps on Charges. IPL= 0%, FPL= 0%. Expires 10/24/22    RW Eligibility Form informing patient about RW services and Caps on charges was sent to patient via MyChart Message      Mickle Asper,  Benefits & Eligibility Coordinator  Time of Intervention: 5 minutes

## 2021-10-25 NOTE — Unmapped (Signed)
Vit D [id number] Rx pended to provider.

## 2021-10-25 NOTE — Unmapped (Signed)
Name: Micheal Rollins  Date: 10/25/2021  Address: [address] [place] [address]   [place] of Residence:  [place]  Phone: [phone]     Started assessment with patient options: in clinic     Is this the same address for mailing? Yes  If No, Mailing Address is:     Librarian, academic    Tax Filing Status  I did not file taxes     Employment Status  Unemployed    Income  No Household Income/Deductions of any kind    If no or low income, how are you meeting your basic needs?  Family Support    List Tax Household Members including relationship to you:   N/A    Someone in my household receives: Not Applicable (for household members)  Specify who: N/A    Do you have a current diagnosis for Hepatitis C?  Lab Results   Component Value Date    HEPCAB Nonreactive 11/07/2020       Have you used tobacco products four or more times per week in the last six months?  No    Teacher, adult education  Patient has affordable insurance through [employer], [place], and or Employment and is not eligible.    Patient given [employer] education if they qualified based on answers to questions above.     Patient was informed of the following programs;   N/A    The following applications/handouts were given to patient:   N/A    The following forms were also started with the patient:   N/A    Juanell Fairly application status: Complete    Patient is applying for [employer] on Charges Only       Mickle Asper,  Benefits & Eligibility Coordinator  Time of Intervention: 5 minutes

## 2021-10-26 LAB — HIV RNA, QUANTITATIVE, PCR
HIV RNA QNT RSLT: DETECTED — AB
HIV RNA: <20 {copies}/mL — ABNORMAL HIGH (ref <0)

## 2021-11-13 ENCOUNTER — Ambulatory Visit: Admit: 2021-11-13 | Payer: PRIVATE HEALTH INSURANCE

## 2021-11-13 NOTE — Unmapped (Unsigned)
Internal Medicine Clinic Visit    Reason for visit: ***    A/P:    ***  There are no diagnoses linked to this encounter.    Health Maintenance:   Health Maintenance   Topic Date Due    Lipid Screening  03/02/2025    DTaP/Tdap/Td Vaccines (3 - Td or Tdap) 10/26/2029    Pneumococcal Vaccine 0-64 (4 - PPSV23 if available, else PCV20) 10/07/2046    Hepatitis C Screen  Completed    COVID-19 Vaccine  Completed    Influenza Vaccine  Completed         No follow-ups on file.  Next Visit:   ***  __________________________________________________________    HPI:  Pt has anxiety, osteopenia and HIV.   Dexa 09/2020:   FINDINGS: The bone mineral density in the spine measuring L1 to 4 measures 1.021 gm/cm2.  The  Z score is -1.5 and the T score is -1.6.         The total bone mineral density in the proximal left femur measures 0.820 gm/cm2.  The Z score is -1.6 and the T score is -2.1.  The femoral neck density is 0.708 gm/cm2, and the T score is -2.3.     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  Wt Readings from Last 3 Encounters:   10/24/21 80.6 kg (177 lb 12.8 oz)   08/23/21 83.9 kg (185 lb)   08/01/21 83.9 kg (185 lb)     Gen: Well appearing, NAD  CV: RRR, no murmurs  Pulm: CTA bilaterally, no crackles or wheezes  Abd: Soft, NTND, normal BS. No HSM.  Ext: No edema  MSK:***  Psych:***  ***  Records review***  Last   Lab Results   Component Value Date    CREATININE 1.14 (H) 10/24/2021    CHOL 217 (H) 03/02/2020    HDL 30 (L) 03/02/2020    LDL 134 (H) 03/02/2020    NONHDL 187 (H) 03/02/2020    TRIG 265 (H) 03/02/2020    A1C 4.9 03/02/2020        The 10-year ASCVD risk score (Arnett DK, et al., 2019) is: 5.6%   Medication adherence and barriers to the treatment plan have been addressed. Opportunities to optimize healthy behaviors have been discussed. Patient / caregiver voiced understanding.    {TIP - [facility]- RAFF Pilot- Clinical Documentation Specialist  Recommendations-  No specialty comments available.   This text will self delete upon signing note:[id number]}     I personally spent *** minutes face-to-face and non-face-to-face in the care of this patient, which includes all pre, intra, and post visit time on the date of service.

## 2021-11-18 MED ORDER — OMEPRAZOLE 20 MG CAPSULE,DELAYED RELEASE
ORAL_CAPSULE | 1 refills | 0 days
Start: 2021-11-18 — End: ?

## 2021-11-20 DIAGNOSIS — G43709 Chronic migraine without aura, not intractable, without status migrainosus: Principal | ICD-10-CM

## 2021-11-20 MED ORDER — OMEPRAZOLE 20 MG CAPSULE,DELAYED RELEASE
ORAL_CAPSULE | 1 refills | 0 days | Status: CP
Start: 2021-11-20 — End: ?

## 2021-11-20 MED ORDER — AMITRIPTYLINE 50 MG TABLET
ORAL_TABLET | Freq: Every evening | ORAL | 3 refills | 90 days
Start: 2021-11-20 — End: 2022-11-20

## 2021-11-21 NOTE — Unmapped (Signed)
Too early

## 2021-11-21 NOTE — Unmapped (Signed)
1st attempt -patient was not available but the person that answer the phone was going to relay the message to patient to give [place] a call back. FYI: Reschedule from the NOW SHOW tab     Renea Ee

## 2021-11-30 NOTE — Unmapped (Signed)
[facility] Administered Medication Refill Coordination Note      NAME:Chritopher Noe Goyer DOB: Dec 30, 1980      Medication: [name]    Day Supply: 56 days      SHIPPING      Next delivery from [name] [phone]) to [facility] for [facility] is scheduled for 06/15.    Clinic contact: BJ Turner    Patient's next nurse visit for administration: 06/29.    We will follow up with clinic monthly for standard refill processing and delivery.      Miral Hoopes Samella Parr  Specialty Pharmacy Technician

## 2021-12-08 DIAGNOSIS — B20 Human immunodeficiency virus [HIV] disease: Principal | ICD-10-CM

## 2021-12-20 ENCOUNTER — Ambulatory Visit: Admit: 2021-12-20 | Payer: PRIVATE HEALTH INSURANCE

## 2021-12-25 NOTE — Unmapped (Signed)
Called Micheal Rollins to ask if he wanted to reschedule his Cabenuva appt or if he had decided to switch back to pills. Left voicemail Call back number given.

## 2022-01-13 DIAGNOSIS — B20 Human immunodeficiency virus [HIV] disease: Principal | ICD-10-CM

## 2022-01-23 NOTE — Unmapped (Addendum)
[facility] Administered Medication Refill Coordination Note      NAME:Micheal Rollins DOB: 1981-02-25      Medication: cabenuva    Day Supply: 56 days      SHIPPING      Next delivery from [facility] [phone]) to  [facility]  for [facility] is scheduled for n/a      UJW:[id number] Jeanella Craze    Patient's next nurse visit for administration: 08/24.-clinic denied refill at this time they already have a dose will f/u in 1 month     We will follow up with clinic monthly for standard refill processing and delivery.      Authur Cubit Samella Parr  Specialty Pharmacy Technician

## 2022-02-02 DIAGNOSIS — B20 Human immunodeficiency virus [HIV] disease: Principal | ICD-10-CM

## 2022-02-22 NOTE — Unmapped (Signed)
[facility] Administered Medication Refill Coordination Note      NAME:Micheal Rollins DOB: Jun 26, 1980      Medication: [name]    Day Supply:  56 days      SHIPPING      Next delivery from [facility] [phone]) to  [facility]  for [name] is scheduled for n/a.    Clinic contact: BJ Turner    Patient's next nurse visit for administration: na clinic denied refill @ this time will follow-up in 8 weeks .    We will follow up with clinic monthly for standard refill processing and delivery.      Zyden Suman Samella Parr  Specialty Pharmacy Technician

## 2022-02-27 NOTE — Unmapped (Signed)
Left discreet message with mother. His # is not a working number. He has not read his past My Chart message. Want to schedule recall with Dr. Carita Pian.

## 2022-03-11 NOTE — Unmapped (Signed)
Micheal Rollins's # is disconnected, mother's VM is full, he doesn't read is My Chart messages, left VM with brother to schedule recall appt. With Dr. Carita Pian.

## 2022-03-30 DIAGNOSIS — B20 Human immunodeficiency virus [HIV] disease: Principal | ICD-10-CM

## 2022-04-19 NOTE — Unmapped (Signed)
Unable to reach Micheal Rollins by phone as number is out of service. Last Cabenuva dose 10/24/2021.   Will remove from active patient list.

## 2022-04-20 NOTE — Unmapped (Signed)
Micheal Rollins has been contacted in regards to their refill of Cabenuva. At this time, they have declined refill due to  clinic denied @ this time  . Refill assessment call date has been updated per the patient's request.

## 2022-05-05 DIAGNOSIS — B20 Human immunodeficiency virus [HIV] disease: Principal | ICD-10-CM

## 2022-05-14 NOTE — Unmapped (Signed)
Specialty Medication(s): Cabenuva    [name] has been dis-enrolled from the [facility] specialty pharmacy services due to  pt has not been seen since June advised clinic would dis-enroll  .    Additional information provided to the patient:      [name]  [facility] Specialty Pharmacist

## 2022-05-24 DIAGNOSIS — B20 Human immunodeficiency virus [HIV] disease: Principal | ICD-10-CM

## 2022-06-30 DIAGNOSIS — B20 Human immunodeficiency virus [HIV] disease: Principal | ICD-10-CM

## 2023-09-12 DIAGNOSIS — R000 Tachycardia, unspecified: Principal | ICD-10-CM

## 2023-10-31 ENCOUNTER — Inpatient Hospital Stay: Admit: 2023-10-31 | Discharge: 2023-11-01 | Payer: PRIVATE HEALTH INSURANCE

## 2023-10-31 DIAGNOSIS — R079 Chest pain, unspecified: Principal | ICD-10-CM

## 2023-11-03 ENCOUNTER — Ambulatory Visit: Admit: 2023-11-03 | Discharge: 2023-11-04 | Payer: PRIVATE HEALTH INSURANCE

## 2023-11-03 DIAGNOSIS — R079 Chest pain, unspecified: Principal | ICD-10-CM

## 2023-12-22 ENCOUNTER — Ambulatory Visit: Admit: 2023-12-22 | Payer: PRIVATE HEALTH INSURANCE

## 2024-04-12 ENCOUNTER — Inpatient Hospital Stay: Admit: 2024-04-12 | Discharge: 2024-04-12 | Payer: PRIVATE HEALTH INSURANCE
